# Patient Record
Sex: Male | Born: 1935 | Hispanic: No | Smoking: Former smoker
Health system: Southern US, Community
[De-identification: ages and names within clinical notes are randomized; demographics above are authoritative.]

## PROBLEM LIST (undated history)

## (undated) DIAGNOSIS — I10 Essential (primary) hypertension: Secondary | ICD-10-CM

## (undated) DIAGNOSIS — K5730 Diverticulosis of large intestine without perforation or abscess without bleeding: Secondary | ICD-10-CM

## (undated) DIAGNOSIS — E785 Hyperlipidemia, unspecified: Secondary | ICD-10-CM

## (undated) DIAGNOSIS — H409 Unspecified glaucoma: Secondary | ICD-10-CM

## (undated) DIAGNOSIS — Z7409 Other reduced mobility: Secondary | ICD-10-CM

## (undated) DIAGNOSIS — N400 Enlarged prostate without lower urinary tract symptoms: Secondary | ICD-10-CM

## (undated) DIAGNOSIS — H3530 Unspecified macular degeneration: Secondary | ICD-10-CM

## (undated) DIAGNOSIS — J449 Chronic obstructive pulmonary disease, unspecified: Secondary | ICD-10-CM

## (undated) DIAGNOSIS — I872 Venous insufficiency (chronic) (peripheral): Secondary | ICD-10-CM

## (undated) DIAGNOSIS — I4891 Unspecified atrial fibrillation: Secondary | ICD-10-CM

## (undated) DIAGNOSIS — H543 Unqualified visual loss, both eyes: Secondary | ICD-10-CM

## (undated) HISTORY — PX: HEMORRHOID SURGERY: SHX153

## (undated) HISTORY — DX: Unspecified atrial fibrillation: I48.91

## (undated) HISTORY — DX: Diverticulosis of large intestine without perforation or abscess without bleeding: K57.30

## (undated) HISTORY — DX: Chronic obstructive pulmonary disease, unspecified: J44.9

## (undated) HISTORY — DX: Other reduced mobility: Z74.09

## (undated) HISTORY — DX: Unspecified glaucoma: H40.9

## (undated) HISTORY — DX: Benign prostatic hyperplasia without lower urinary tract symptoms: N40.0

## (undated) HISTORY — DX: Unspecified macular degeneration: H35.30

## (undated) HISTORY — DX: Venous insufficiency (chronic) (peripheral): I87.2

## (undated) HISTORY — DX: Hyperlipidemia, unspecified: E78.5

## (undated) HISTORY — DX: Essential (primary) hypertension: I10

## (undated) HISTORY — DX: Unqualified visual loss, both eyes: H54.3

---

## 2010-03-20 HISTORY — PX: INGUINAL HERNIA REPAIR: SHX194

## 2013-12-08 ENCOUNTER — Encounter: Payer: Self-pay | Admitting: Family Medicine

## 2013-12-08 ENCOUNTER — Ambulatory Visit (INDEPENDENT_AMBULATORY_CARE_PROVIDER_SITE_OTHER): Payer: Medicare Other | Admitting: Family Medicine

## 2013-12-08 VITALS — BP 134/78 | HR 65 | Temp 98.8°F | Resp 18 | Ht 66.0 in | Wt 155.0 lb

## 2013-12-08 DIAGNOSIS — Z7409 Other reduced mobility: Secondary | ICD-10-CM

## 2013-12-08 DIAGNOSIS — I10 Essential (primary) hypertension: Secondary | ICD-10-CM

## 2013-12-08 DIAGNOSIS — H409 Unspecified glaucoma: Secondary | ICD-10-CM

## 2013-12-08 DIAGNOSIS — R69 Illness, unspecified: Secondary | ICD-10-CM

## 2013-12-08 DIAGNOSIS — E785 Hyperlipidemia, unspecified: Secondary | ICD-10-CM

## 2013-12-08 DIAGNOSIS — Z23 Encounter for immunization: Secondary | ICD-10-CM

## 2013-12-08 DIAGNOSIS — Z7189 Other specified counseling: Secondary | ICD-10-CM

## 2013-12-08 DIAGNOSIS — H3530 Unspecified macular degeneration: Secondary | ICD-10-CM

## 2013-12-08 DIAGNOSIS — Z7689 Persons encountering health services in other specified circumstances: Secondary | ICD-10-CM

## 2013-12-08 NOTE — Progress Notes (Signed)
Pre visit review using our clinic review tool, if applicable. No additional management support is needed unless otherwise documented below in the visit note. 

## 2013-12-08 NOTE — Progress Notes (Signed)
Office Note 12/09/2013  CC:  Chief Complaint  Patient presents with  . Establish Care    DR. Meixler from [place]   HPI:  Andrew Moses is a 80 y.o. White male who is here to establish care. Patient's most recent primary MD: Dr. Lonzo Candy in [place], [place]. Old records were not reviewed prior to or during today's visit.  Pneumonia vaccine 2014 per pt report. Reports chronic illnesses well controlled historically. Takes meds faithfully.  Poor eyesight limits him.  Macular deg--needs retinal specialist.   Also says he had recent blood tests that showed anemia, he took iron for a month or so, went back for another exam but was told retesting wasn't needed.  He states that other "routine"-type blood work has been done in the last few months as well.  He feels well, no acute complaints.   Past Medical History  Diagnosis Date  . HTN (hypertension)   . Hyperlipidemia   . BPH (benign prostatic hypertrophy)   . Glaucoma   . Diverticulosis of colon     w/ hx of "itis"  . Macular degeneration     L eye with minimal vision  . Impaired mobility   . Chronic venous insufficiency   . Impaired vision in both eyes     L>>R    Past Surgical History  Procedure Laterality Date  . Inguinal hernia repair  2012    bilat  . Hemorrhoid surgery      remote past    History reviewed. No pertinent family history.  History   Social History  . Marital Status: Widowed    Spouse Name: N/A    Number of Children: N/A  . Years of Education: N/A   Occupational History  . Not on file.   Social History Main Topics  . Smoking status: Former Games developer  . Smokeless tobacco: Never Used  . Alcohol Use: Yes     Comment: 4-5 oz daily  . Drug Use: No  . Sexual Activity: Not on file   Other Topics Concern  . Not on file   Social History Narrative   Widowed since 1998.   Retired Airline pilot.   Moved to [place] to be with in-laws and get closer supervision.   Remote tobacco hx, quit in his 40s.   Daily 4-5 oz of alcohol.   Exercises daily--stretches/light exercises in his home.   Enjoys DVDs and Music for relaxation.    Outpatient Encounter Prescriptions as of 12/08/2013  Medication Sig  . Aspirin (ECOTRIN PO) Take 160 mg by mouth.  Marland Kitchen atenolol (TENORMIN) 25 MG tablet Take 25 mg by mouth daily.  . bimatoprost (LUMIGAN) 0.03 % ophthalmic solution 1 drop at bedtime.  . cyanocobalamin 2000 MCG tablet Take 2,500 mcg by mouth daily.  Marland Kitchen losartan (COZAAR) 50 MG tablet Take 50 mg by mouth daily.  . Multiple Vitamin (MULTIVITAMIN) tablet Take 1 tablet by mouth daily.  . simvastatin (ZOCOR) 20 MG tablet Take 20 mg by mouth daily.  . tamsulosin (FLOMAX) 0.4 MG CAPS capsule Take 0.4 mg by mouth.   ROS Review of Systems  Constitutional: Negative for fever and fatigue.  HENT: Negative for congestion and sore throat.   Eyes: Negative for redness. Visual disturbance: no acute visual disturbance.  Respiratory: Negative for cough.   Cardiovascular: Positive for leg swelling (chronic bilateral LE venous insufficiency edema). Negative for chest pain.  Gastrointestinal: Negative for nausea and abdominal pain.  Genitourinary: Negative for dysuria.  Musculoskeletal: Negative for back pain and  joint swelling.  Skin: Negative for rash.  Neurological: Negative for weakness and headaches.  Hematological: Negative for adenopathy.  Psychiatric/Behavioral: Negative for confusion and dysphoric mood. The patient is not nervous/anxious.     PE; Blood pressure 134/78, pulse 65, temperature 98.8 F (37.1 C), temperature source Temporal, resp. rate 18, height  (1.676 m), weight 155 lb (70.308 kg), SpO2 99.00%. Repeat bp 130s/70s -manual cuff. Gen: Alert, well appearing.  Patient is oriented to person, place, time, and situation. AFFECT: pleasant, lucid thought and speech. ZOX:WRUE: no injection, icteris, swelling, or exudate.  EOMI, L pupil 4mm and nonreactive, R pupil 1-2 mm and nonreactive---this is  stable/chronic as per pt report today.  L RR dimmer than right. Mouth: lips without lesion/swelling.  Oral mucosa pink and moist. Oropharynx without erythema, exudate, or swelling.  Neck - No masses or thyromegaly or limitation in range of motion CV: RRR, 3/6 holosystolic murmur, S1 obscured, S2 distinct.  No diastolic murmur, no rub, no gallop.   Chest is clear, no wheezing or rales. Normal symmetric air entry throughout both lung fields. No chest wall deformities or tenderness. ABD: soft, NT/ND EXT: 2+ pitting edema in both LL's below the knees, with pigment changes c/w CV stasis  Pertinent labs:  None today  ASSESSMENT AND PLAN:   Encounter to establish care Reviewed history, current needs, will obtain old records.  Macular degeneration And glaucoma. Needs retinal specialist referral for ongoing care (he reports getting regularly scheduled eye injections in [place])--referred to Dr. [place] Royalty today.  HTN (hypertension), benign The current medical regimen is effective;  continue present plan and medications. Will review old records for most recent labs and determine if any are needed prior to our planned 4 mo f/u.  Hyperlipidemia Tolerating statin. Review old records for most recent lipid panel and hepatic panel.   Impaired mobility Generalized weakness and balance/instability impairment consistent with aging. Rolling walker rx'd today for help with balance and mobility.   Preventative health care: flu vaccine IM today.  An After Visit Summary was printed and given to the patient.  Return in about 4 months (around 04/09/2014) for routine chronic illness f/u.

## 2013-12-09 ENCOUNTER — Encounter: Payer: Self-pay | Admitting: Family Medicine

## 2013-12-09 DIAGNOSIS — E785 Hyperlipidemia, unspecified: Secondary | ICD-10-CM | POA: Insufficient documentation

## 2013-12-09 DIAGNOSIS — I10 Essential (primary) hypertension: Secondary | ICD-10-CM | POA: Insufficient documentation

## 2013-12-09 DIAGNOSIS — Z7409 Other reduced mobility: Secondary | ICD-10-CM | POA: Insufficient documentation

## 2013-12-09 DIAGNOSIS — H3530 Unspecified macular degeneration: Secondary | ICD-10-CM | POA: Insufficient documentation

## 2013-12-09 DIAGNOSIS — Z7689 Persons encountering health services in other specified circumstances: Secondary | ICD-10-CM | POA: Insufficient documentation

## 2013-12-09 NOTE — Assessment & Plan Note (Signed)
Generalized weakness and balance/instability impairment consistent with aging. Rolling walker rx'd today for help with balance and mobility.

## 2013-12-09 NOTE — Assessment & Plan Note (Signed)
Tolerating statin. Review old records for most recent lipid panel and hepatic panel.

## 2013-12-09 NOTE — Assessment & Plan Note (Signed)
And glaucoma. Needs retinal specialist referral for ongoing care (he reports getting regularly scheduled eye injections in [place])--referred to Dr. [place] Royalty today.

## 2013-12-09 NOTE — Assessment & Plan Note (Signed)
Reviewed history, current needs, will obtain old records.

## 2013-12-09 NOTE — Assessment & Plan Note (Signed)
The current medical regimen is effective;  continue present plan and medications. Will review old records for most recent labs and determine if any are needed prior to our planned 4 mo f/u.

## 2013-12-22 ENCOUNTER — Encounter (INDEPENDENT_AMBULATORY_CARE_PROVIDER_SITE_OTHER): Payer: Medicare Other | Admitting: Ophthalmology

## 2013-12-22 DIAGNOSIS — H34833 Tributary (branch) retinal vein occlusion, bilateral: Secondary | ICD-10-CM

## 2013-12-22 DIAGNOSIS — I10 Essential (primary) hypertension: Secondary | ICD-10-CM

## 2013-12-22 DIAGNOSIS — H3531 Nonexudative age-related macular degeneration: Secondary | ICD-10-CM

## 2013-12-22 DIAGNOSIS — H43813 Vitreous degeneration, bilateral: Secondary | ICD-10-CM

## 2013-12-22 DIAGNOSIS — H35033 Hypertensive retinopathy, bilateral: Secondary | ICD-10-CM

## 2013-12-24 ENCOUNTER — Encounter: Payer: Self-pay | Admitting: Podiatry

## 2013-12-24 ENCOUNTER — Ambulatory Visit (INDEPENDENT_AMBULATORY_CARE_PROVIDER_SITE_OTHER): Payer: Medicare Other | Admitting: Podiatry

## 2013-12-24 VITALS — BP 124/60 | HR 60 | Resp 12

## 2013-12-24 DIAGNOSIS — M79676 Pain in unspecified toe(s): Secondary | ICD-10-CM

## 2013-12-24 DIAGNOSIS — B351 Tinea unguium: Secondary | ICD-10-CM

## 2013-12-24 DIAGNOSIS — L84 Corns and callosities: Secondary | ICD-10-CM

## 2013-12-24 NOTE — Progress Notes (Signed)
   Subjective:    Patient ID: Andrew Moses, male    DOB: [birth year], 80 y.o.   MRN: [id number]  HPI N-SORE, THICK SKIN L-B/L FOOT D-4 YEARS O-SLOWLY C-WORSE A-PRESSURE T-NONE  He is also complaining of painful toenails and walking wearing shoes and request debridement He describes podiatric care including debridement is toenails and keratoses   Review of Systems  Musculoskeletal: Positive for gait problem.  All other systems reviewed and are negative.      Objective:   Physical Exam  Orientated x3 white male  Vascular: DP and PT pulses 2/4 bilaterally Pitting edema dorsal ankles bilaterally  Neurological: Sensation to 10 g monofilament wire intact 2/5 right and 3/5 left Vibratory sensation nonreactive bilaterally Ankle reflexes reactive bilaterally  Dermatological: Manson PasseyBrown hyperpigmented skin lower legs and ankles bilaterally  The toenails are elongated, brittle, hypertrophic, discolored 6-10 Plantar keratoses first MPJ bilaterally Keratoses distal third toes bilaterally  Musculoskeletal: Medium March contour bilaterally No deformities noted bilaterally There is no restriction ankle, subtalar, midtarsal joints bilaterally       Assessment & Plan:   Assessment: Peripheral neuropathy bilaterally Symptomatic onychomycoses 6-10 Keratoses x4  Plan: Nails x10 and keratoses x4 debrided without a bleeding  Reappoint at three-month intervals for skin a nail debridement

## 2013-12-30 ENCOUNTER — Other Ambulatory Visit: Payer: Self-pay | Admitting: Family Medicine

## 2013-12-30 MED ORDER — ATENOLOL 25 MG PO TABS: 25.0000 mg | ORAL_TABLET | Freq: Every day | ORAL | Status: DC

## 2014-01-05 ENCOUNTER — Encounter (INDEPENDENT_AMBULATORY_CARE_PROVIDER_SITE_OTHER): Payer: Medicare Other | Admitting: Ophthalmology

## 2014-01-06 ENCOUNTER — Ambulatory Visit (HOSPITAL_BASED_OUTPATIENT_CLINIC_OR_DEPARTMENT_OTHER)
Admission: RE | Admit: 2014-01-06 | Discharge: 2014-01-06 | Disposition: A | Discharge status: Home / Self Care | Payer: Medicare Other | Source: Ambulatory Visit | Attending: Family Medicine | Admitting: Family Medicine

## 2014-01-06 ENCOUNTER — Encounter: Payer: Self-pay | Admitting: Family Medicine

## 2014-01-06 ENCOUNTER — Ambulatory Visit (INDEPENDENT_AMBULATORY_CARE_PROVIDER_SITE_OTHER): Payer: Medicare Other | Admitting: Family Medicine

## 2014-01-06 VITALS — BP 119/77 | HR 105 | Temp 99.6°F | Resp 22 | Ht 66.0 in | Wt 156.0 lb

## 2014-01-06 DIAGNOSIS — I499 Cardiac arrhythmia, unspecified: Secondary | ICD-10-CM | POA: Insufficient documentation

## 2014-01-06 DIAGNOSIS — M11231 Other chondrocalcinosis, right wrist: Secondary | ICD-10-CM | POA: Insufficient documentation

## 2014-01-06 DIAGNOSIS — M19041 Primary osteoarthritis, right hand: Secondary | ICD-10-CM | POA: Insufficient documentation

## 2014-01-06 DIAGNOSIS — I4891 Unspecified atrial fibrillation: Secondary | ICD-10-CM

## 2014-01-06 DIAGNOSIS — S66911A Strain of unspecified muscle, fascia and tendon at wrist and hand level, right hand, initial encounter: Secondary | ICD-10-CM

## 2014-01-06 DIAGNOSIS — M25531 Pain in right wrist: Secondary | ICD-10-CM | POA: Diagnosis present

## 2014-01-06 DIAGNOSIS — M85841 Other specified disorders of bone density and structure, right hand: Secondary | ICD-10-CM | POA: Diagnosis not present

## 2014-01-06 HISTORY — DX: Unspecified atrial fibrillation: I48.91

## 2014-01-06 MED ORDER — TAMSULOSIN HCL 0.4 MG PO CAPS: 0.4000 mg | ORAL_CAPSULE | Freq: Every day | ORAL | Status: DC

## 2014-01-06 NOTE — Progress Notes (Signed)
OFFICE VISIT  01/06/2014   CC:  Chief Complaint  Patient presents with  . Wrist Pain    Right sided    HPI:    Patient is a 80 y.o. Caucasian male who presents for right wrist pain. Hx of recurrent right wrist pain for years, and in the last few days it is hurting significantly worse. Increased pain noted after straining wrist during a slip from his bed.  No hx of wrist fracture or other bone fracture.  The wrist hurts ONLY if he touches the volar surface or tries to use the wrist.  No OTc meds tried lately.   He denies feeling SOB, no recent cough, no fevers.  Past Medical History  Diagnosis Date  . HTN (hypertension)   . Hyperlipidemia   . BPH (benign prostatic hypertrophy)   . Glaucoma   . Diverticulosis of colon     w/ hx of "itis"  . Macular degeneration     L eye with minimal vision  . Impaired mobility   . Chronic venous insufficiency   . Impaired vision in both eyes     L>>R    Past Surgical History  Procedure Laterality Date  . Inguinal hernia repair  2012    bilat  . Hemorrhoid surgery      remote past    Outpatient Prescriptions Prior to Visit  Medication Sig Dispense Refill  . Aspirin (ECOTRIN PO) Take 160 mg by mouth.      Marland Kitchen. atenolol (TENORMIN) 25 MG tablet Take 1 tablet (25 mg total) by mouth daily.  30 tablet  3  . bimatoprost (LUMIGAN) 0.03 % ophthalmic solution 1 drop at bedtime.      . cyanocobalamin 2000 MCG tablet Take 2,500 mcg by mouth daily.      Marland Kitchen. losartan (COZAAR) 50 MG tablet Take 50 mg by mouth daily.      . Multiple Vitamin (MULTIVITAMIN) tablet Take 1 tablet by mouth daily.      . simvastatin (ZOCOR) 20 MG tablet Take 20 mg by mouth daily.      . tamsulosin (FLOMAX) 0.4 MG CAPS capsule Take 0.4 mg by mouth.       No facility-administered medications prior to visit.    No Known Allergies  ROS As per HPI  PE: Blood pressure 119/77, pulse 105, temperature 99.6 F (37.6 C), temperature source Temporal, resp. rate 22, height 5\' 6"   (1.676 m), weight 156 lb (70.761 kg), SpO2 95.00%. Gen: Alert, well appearing.  Patient is oriented to person, place, time, and situation. L pupil 2-3 mm and nonreactive, R pupil pinpoint and nonreactive. EOMI.  No facial or tongue asymmetry.   CV: Irregular rhythm, tachy to 120 or so, 1-2 systolic murmur at apex, without rub or gallop. Chest is clear, no wheezing or rales. Normal symmetric air entry throughout both lung fields. No chest wall deformities or tenderness. Right wrist with limited ROM due to pain/stiffness, esp in flexion.  No swelling or erythema.  Mild focal TTP over  Wrist flexor tendons where they cross the wrist into the palm.  Tinel's neg.  He is unable to do phalen's testing due to stiffness of both wrists. Finger color and strength normal bilat.    LABS:  None today  IMPRESSION AND PLAN:  1) Acute-on-chronic right wrist pain, likely strain assoc with recent "slip" out of bed. This was an "innocent" slip out of bed, per his report---no presyncope or syncope and he says he has not been feeling ill  lately in any way.  Fitted pt with right wrist brace and ordered wrist x-ray today.  2) Irregular rhythm, tachycardia (mild): suspect a fib/flutter with RVR. I tried to engage pt in conversation about this today, particularly making sure he was aware of increased risk of stroke with this type of arrhythmia.  He quickly and quite pointedly stated that he knew he had an arrhythmia already and that he wanted no procedures or blood thinners.  He says he does not feel palpitations, CP, SOB, or dizziness.  Therefore, I agreed to do no EKG and no labs today and will not push him to take coumadin or DOAC.  No changes in meds today.   He reiterated that [place] new [place] had [place] condition as well and pt says the approach they agreed upon was NO WORKUP or anticoagulation (other than his 162mg  ASA qd).  An After Visit Summary was printed and given to the patient.  Spent 25 min with pt  today, with >50% of this time spent in counseling and care coordination regarding the above problems.  FOLLOW UP: Return if symptoms worsen or fail to improve.

## 2014-01-06 NOTE — Progress Notes (Signed)
Pre visit review using our clinic review tool, if applicable. No additional management support is needed unless otherwise documented below in the visit note. 

## 2014-01-26 ENCOUNTER — Encounter (INDEPENDENT_AMBULATORY_CARE_PROVIDER_SITE_OTHER): Payer: Medicare Other | Admitting: Ophthalmology

## 2014-01-26 DIAGNOSIS — H35033 Hypertensive retinopathy, bilateral: Secondary | ICD-10-CM

## 2014-01-26 DIAGNOSIS — I10 Essential (primary) hypertension: Secondary | ICD-10-CM

## 2014-01-26 DIAGNOSIS — H43813 Vitreous degeneration, bilateral: Secondary | ICD-10-CM

## 2014-01-26 DIAGNOSIS — H34833 Tributary (branch) retinal vein occlusion, bilateral: Secondary | ICD-10-CM

## 2014-02-19 ENCOUNTER — Encounter: Payer: Self-pay | Admitting: Podiatry

## 2014-02-19 ENCOUNTER — Ambulatory Visit (INDEPENDENT_AMBULATORY_CARE_PROVIDER_SITE_OTHER): Payer: Medicare Other | Admitting: Podiatry

## 2014-02-19 DIAGNOSIS — R609 Edema, unspecified: Secondary | ICD-10-CM

## 2014-02-19 DIAGNOSIS — I83211 Varicose veins of right lower extremity with both ulcer of thigh and inflammation: Secondary | ICD-10-CM

## 2014-02-19 DIAGNOSIS — I83212 Varicose veins of right lower extremity with both ulcer of calf and inflammation: Secondary | ICD-10-CM | POA: Diagnosis not present

## 2014-02-19 DIAGNOSIS — I83219 Varicose veins of right lower extremity with both ulcer of unspecified site and inflammation: Secondary | ICD-10-CM | POA: Diagnosis not present

## 2014-02-19 DIAGNOSIS — I83213 Varicose veins of right lower extremity with both ulcer of ankle and inflammation: Secondary | ICD-10-CM | POA: Diagnosis not present

## 2014-02-19 DIAGNOSIS — I83214 Varicose veins of right lower extremity with both ulcer of heel and midfoot and inflammation: Secondary | ICD-10-CM

## 2014-02-19 DIAGNOSIS — I83218 Varicose veins of right lower extremity with both ulcer of other part of lower extremity and inflammation: Secondary | ICD-10-CM

## 2014-02-19 DIAGNOSIS — I83215 Varicose veins of right lower extremity with both ulcer other part of foot and inflammation: Secondary | ICD-10-CM

## 2014-02-19 NOTE — Progress Notes (Signed)
Subjective:     Patient ID: Andrew Moses, male   DOB: [birth year], 80 y.o.   MRN: [id number]  HPI patient states with caregiver that his right foot in the ankle has been weeping with some clear drainage and that he has had some swelling in his foot and ankle. States it's been present for a few days   Review of Systems     Objective:   Physical Exam Neurovascular status unchanged from previous visits with edema in the right foot and ankle right that is related to his venous system and he does have on the medial side some very slight breakdown with weepiness but no active ulceration    Assessment:     Edema of the right ankle secondary to venous disease with very beginnings of ulceration medial side    Plan:     Educated patient and caregiver and today applied Unna boot and Ace wrap surgical shoe and instructed if any changes in toe color were to recur or this were to give him any discomfort to take it off immediately if not leave on for 3 days

## 2014-02-23 ENCOUNTER — Ambulatory Visit (INDEPENDENT_AMBULATORY_CARE_PROVIDER_SITE_OTHER): Payer: Medicare Other | Admitting: Podiatry

## 2014-02-23 ENCOUNTER — Encounter: Payer: Self-pay | Admitting: Podiatry

## 2014-02-23 VITALS — BP 149/84 | HR 88 | Resp 16

## 2014-02-23 DIAGNOSIS — I83215 Varicose veins of right lower extremity with both ulcer other part of foot and inflammation: Secondary | ICD-10-CM

## 2014-02-23 DIAGNOSIS — I83212 Varicose veins of right lower extremity with both ulcer of calf and inflammation: Secondary | ICD-10-CM

## 2014-02-23 DIAGNOSIS — I83218 Varicose veins of right lower extremity with both ulcer of other part of lower extremity and inflammation: Secondary | ICD-10-CM

## 2014-02-23 DIAGNOSIS — I83214 Varicose veins of right lower extremity with both ulcer of heel and midfoot and inflammation: Secondary | ICD-10-CM

## 2014-02-23 DIAGNOSIS — I83219 Varicose veins of right lower extremity with both ulcer of unspecified site and inflammation: Secondary | ICD-10-CM

## 2014-02-23 DIAGNOSIS — I83213 Varicose veins of right lower extremity with both ulcer of ankle and inflammation: Secondary | ICD-10-CM

## 2014-02-23 DIAGNOSIS — I83211 Varicose veins of right lower extremity with both ulcer of thigh and inflammation: Secondary | ICD-10-CM

## 2014-02-23 NOTE — Patient Instructions (Signed)
Keep right leg at the level legs when sitting Removal of boot on right leg the evening before next visit or the morning of the next visit and shower at that time Wear surgical shoe on right foot to accommodate the [employer] LockerUnna boot

## 2014-02-23 NOTE — Progress Notes (Signed)
Patient ID: Andrew Moses, male   DOB: 1914/07/10, 80 y.o.   MRN: [id number]  Subjective: This patient presents with his daughter-in-law to evaluate skin lesions on right lower ankle which were treated on 02/19/2014 with a unna boot. Patient wore the [employer]ockerUnna boot for several days and remove the boot  Objective: 80 year old white male appears orientated 3 transfers from wheelchair to treatment chair  Palpable and symmetrical pedal pulses bilaterally Pitting edema right and left lower legs with hyperpigmentation noted Superficial ulcerations distal medial right ankle 8 mm with granular base and more proximal lesions 5 mm. Low-grade erythema surrounds both sites with serous drainage noted.  Assessment:  Pitting edema bilaterally Venous stasis ulcerations 2 , right ankle  Plan: Reapply little boat and Coflex bandage to the right lower leg Advised to leave bandages on until the night before morning of next visit in 7 days

## 2014-03-02 ENCOUNTER — Ambulatory Visit (INDEPENDENT_AMBULATORY_CARE_PROVIDER_SITE_OTHER): Payer: Medicare Other | Admitting: Podiatry

## 2014-03-02 ENCOUNTER — Encounter: Payer: Self-pay | Admitting: Podiatry

## 2014-03-02 VITALS — BP 158/84 | HR 99 | Temp 97.9°F | Resp 17

## 2014-03-02 DIAGNOSIS — I83219 Varicose veins of right lower extremity with both ulcer of unspecified site and inflammation: Secondary | ICD-10-CM

## 2014-03-02 DIAGNOSIS — R600 Localized edema: Secondary | ICD-10-CM

## 2014-03-02 DIAGNOSIS — I83214 Varicose veins of right lower extremity with both ulcer of heel and midfoot and inflammation: Secondary | ICD-10-CM

## 2014-03-02 DIAGNOSIS — I83213 Varicose veins of right lower extremity with both ulcer of ankle and inflammation: Secondary | ICD-10-CM

## 2014-03-02 DIAGNOSIS — I83211 Varicose veins of right lower extremity with both ulcer of thigh and inflammation: Secondary | ICD-10-CM

## 2014-03-02 DIAGNOSIS — I83215 Varicose veins of right lower extremity with both ulcer other part of foot and inflammation: Secondary | ICD-10-CM

## 2014-03-02 DIAGNOSIS — I83218 Varicose veins of right lower extremity with both ulcer of other part of lower extremity and inflammation: Secondary | ICD-10-CM

## 2014-03-02 DIAGNOSIS — I83212 Varicose veins of right lower extremity with both ulcer of calf and inflammation: Secondary | ICD-10-CM

## 2014-03-02 NOTE — Patient Instructions (Signed)
Remove boot night before next visit

## 2014-03-03 NOTE — Progress Notes (Signed)
Patient ID: Andrew Moses, male   DOB: 1914-06-05, 80 y.o.   MRN: [id number]  Subjective: Patient presents with daughter in law to evaluate ongoing venous ulcers on the right lower extremity.Andrew Moses: Unna boots 2 applied on 02/19/2014 and 02/23/2014.  Objective: Patient appears very lethargic, however, he does respond to questioning  Medial right ankle The proximal medial right ankle ulcer is superficial measuring 3 x 5 mm The distal medial ulcer is closed  Assessment: Reducing ulceration size right ankle Closure of the distal medial right varicose ulcer  Plan: Apply Unna boot #32 right lower extremity Remove the night before next visit in 7 days  Advised patient's daughter not to have medical evaluation as soon as possible for Andrew Moses  Reappoint 7 days

## 2014-03-04 ENCOUNTER — Ambulatory Visit (INDEPENDENT_AMBULATORY_CARE_PROVIDER_SITE_OTHER): Payer: Medicare Other | Admitting: Family Medicine

## 2014-03-04 ENCOUNTER — Encounter: Payer: Self-pay | Admitting: Family Medicine

## 2014-03-04 ENCOUNTER — Ambulatory Visit (HOSPITAL_BASED_OUTPATIENT_CLINIC_OR_DEPARTMENT_OTHER)
Admission: RE | Admit: 2014-03-04 | Discharge: 2014-03-04 | Disposition: A | Discharge status: Home / Self Care | Payer: Medicare Other | Source: Ambulatory Visit | Attending: Family Medicine | Admitting: Family Medicine

## 2014-03-04 VITALS — BP 145/80 | HR 106 | Temp 98.0°F | Resp 24

## 2014-03-04 DIAGNOSIS — R7989 Other specified abnormal findings of blood chemistry: Secondary | ICD-10-CM

## 2014-03-04 DIAGNOSIS — I482 Chronic atrial fibrillation, unspecified: Secondary | ICD-10-CM

## 2014-03-04 DIAGNOSIS — I700 Atherosclerosis of aorta: Secondary | ICD-10-CM | POA: Diagnosis not present

## 2014-03-04 DIAGNOSIS — R918 Other nonspecific abnormal finding of lung field: Secondary | ICD-10-CM | POA: Diagnosis not present

## 2014-03-04 DIAGNOSIS — R0602 Shortness of breath: Secondary | ICD-10-CM | POA: Insufficient documentation

## 2014-03-04 DIAGNOSIS — J90 Pleural effusion, not elsewhere classified: Secondary | ICD-10-CM | POA: Insufficient documentation

## 2014-03-04 DIAGNOSIS — R946 Abnormal results of thyroid function studies: Secondary | ICD-10-CM

## 2014-03-04 LAB — CBC WITH DIFFERENTIAL/PLATELET
Basophils Absolute: 0.1 10*3/uL (ref 0.0–0.1)
Basophils Relative: 0.6 % (ref 0.0–3.0)
EOS PCT: 1.6 % (ref 0.0–5.0)
Eosinophils Absolute: 0.1 10*3/uL (ref 0.0–0.7)
HEMATOCRIT: 38.4 % — AB (ref 39.0–52.0)
Hemoglobin: 12.3 g/dL — ABNORMAL LOW (ref 13.0–17.0)
LYMPHS ABS: 1.2 10*3/uL (ref 0.7–4.0)
Lymphocytes Relative: 14.4 % (ref 12.0–46.0)
MCHC: 32.1 g/dL (ref 30.0–36.0)
MCV: 92.4 fl (ref 78.0–100.0)
Monocytes Absolute: 0.8 10*3/uL (ref 0.1–1.0)
Monocytes Relative: 9.1 % (ref 3.0–12.0)
Neutro Abs: 6.1 10*3/uL (ref 1.4–7.7)
Neutrophils Relative %: 74.3 % (ref 43.0–77.0)
Platelets: 242 10*3/uL (ref 150.0–400.0)
RBC: 4.16 Mil/uL — ABNORMAL LOW (ref 4.22–5.81)
RDW: 13.9 % (ref 11.5–15.5)
WBC: 8.2 10*3/uL (ref 4.0–10.5)

## 2014-03-04 LAB — COMPREHENSIVE METABOLIC PANEL
ALK PHOS: 71 U/L (ref 39–117)
ALT: 28 U/L (ref 0–53)
AST: 34 U/L (ref 0–37)
Albumin: 3.4 g/dL — ABNORMAL LOW (ref 3.5–5.2)
BUN: 23 mg/dL (ref 6–23)
CO2: 27 mEq/L (ref 19–32)
Calcium: 8.8 mg/dL (ref 8.4–10.5)
Chloride: 103 mEq/L (ref 96–112)
Creatinine, Ser: 0.8 mg/dL (ref 0.4–1.5)
GFR: 97.74 mL/min (ref >60.00)
GLUCOSE: 119 mg/dL — AB (ref 70–99)
Potassium: 4.2 mEq/L (ref 3.5–5.1)
Sodium: 138 mEq/L (ref 135–145)
TOTAL PROTEIN: 5.3 g/dL — AB (ref 6.0–8.3)
Total Bilirubin: 0.5 mg/dL (ref 0.2–1.2)

## 2014-03-04 LAB — BRAIN NATRIURETIC PEPTIDE: PRO B NATRI PEPTIDE: 498 pg/mL — AB (ref 0.0–100.0)

## 2014-03-04 LAB — TSH: TSH: 5.31 u[IU]/mL — AB (ref 0.35–4.50)

## 2014-03-04 MED ORDER — AZITHROMYCIN 250 MG PO TABS: ORAL_TABLET | ORAL | Status: DC

## 2014-03-04 NOTE — Progress Notes (Signed)
OFFICE NOTE  03/04/2014  CC:  Chief Complaint  Patient presents with  . Follow-up   HPI: Patient is a 80 y.o. Caucasian male who is here for wheezing.  Insidious onset, worsening slowly over the last 3 wks.  Daughter in law with him today says he has been resistant to coming in to see me over the last few weeks.  He feels SOB but has no cough and is not coughing up phlegm but says his chest doesn't feel right. Denies CP.  Increased LE swelling lately, requires an UNA boot on right LL lately (podiatrist-Dr. Leeanne Deeduchman).  No nausea, no palpitations or heart skipping/racing.   Pertinent PMH:  Past medical, surgical, social, and family history reviewed and no changes are noted since last office visit.  MEDS:  Outpatient Prescriptions Prior to Visit  Medication Sig Dispense Refill  . Aspirin (ECOTRIN PO) Take 160 mg by mouth.    Marland Kitchen. atenolol (TENORMIN) 25 MG tablet Take 1 tablet (25 mg total) by mouth daily. 30 tablet 3  . bimatoprost (LUMIGAN) 0.03 % ophthalmic solution 1 drop at bedtime.    . cyanocobalamin 2000 MCG tablet Take 2,500 mcg by mouth daily.    Marland Kitchen. losartan (COZAAR) 50 MG tablet Take 50 mg by mouth daily.    . Multiple Vitamin (MULTIVITAMIN) tablet Take 1 tablet by mouth daily.    . simvastatin (ZOCOR) 20 MG tablet Take 20 mg by mouth daily.    . tamsulosin (FLOMAX) 0.4 MG CAPS capsule Take 1 capsule (0.4 mg total) by mouth daily. 90 capsule 1   No facility-administered medications prior to visit.    PE: Blood pressure 145/80, pulse 106, temperature 98 F (36.7 C), temperature source Temporal, resp. rate 24, SpO2 96 %. Gen: Alert, sitting in wheelchair breathing heavily.  Patient is oriented to person, place, time, and situation.  He speaks lucidly. ZOX:WRUEENT:Eyes: no injection, icteris, swelling, or exudate.  EOMI Mouth: lips without lesion/swelling.  Oral mucosa pink and moist. Oropharynx without erythema, exudate, or swelling.  CV: Irreg irreg, tachy, no murmur LUNGS: clear  but mildly diminished aeration on insp and exp, with prolonged exp phase. Mildly labored resps. ABD: soft, ND/NT. EXT: 3+ pitting edema in both LLs.  UNA boot on R LL.  12 lead EKG today: a-fib, rate 98, poor R wave progression, frequent ectopic vent beat. No prior EKG for comparison.  IMPRESSION AND PLAN:  1) SOB w/out pain or cough.  With hx of a fib I suspect he may be having periods of RVR and inadequate LV output leading to CHF.   Will check CBC, CMET, TSH, BNP and CXR today. If signif pulm edema on CXR then I told pt he will need to go to hosp to get this treated. If mild pulm vasc congest or no edema/some finding suggesting different path process then will treat approp as outpatient.    An After Visit Summary was printed and given to the patient.  FOLLOW UP: 2 d in office.  May need [facility] nursing consult.

## 2014-03-04 NOTE — Addendum Note (Signed)
Addended by: Eulah PontALBRIGHT, LISA M on: 03/04/2014 01:53 PM   Modules accepted: Orders

## 2014-03-04 NOTE — Progress Notes (Signed)
Pre visit review using our clinic review tool, if applicable. No additional management support is needed unless otherwise documented below in the visit note. 

## 2014-03-05 ENCOUNTER — Encounter (INDEPENDENT_AMBULATORY_CARE_PROVIDER_SITE_OTHER): Payer: Medicare Other | Admitting: Ophthalmology

## 2014-03-05 ENCOUNTER — Other Ambulatory Visit (INDEPENDENT_AMBULATORY_CARE_PROVIDER_SITE_OTHER): Payer: Medicare Other

## 2014-03-05 ENCOUNTER — Other Ambulatory Visit: Payer: Self-pay | Admitting: Family Medicine

## 2014-03-05 DIAGNOSIS — H43813 Vitreous degeneration, bilateral: Secondary | ICD-10-CM

## 2014-03-05 DIAGNOSIS — H35033 Hypertensive retinopathy, bilateral: Secondary | ICD-10-CM

## 2014-03-05 DIAGNOSIS — I10 Essential (primary) hypertension: Secondary | ICD-10-CM

## 2014-03-05 DIAGNOSIS — R946 Abnormal results of thyroid function studies: Secondary | ICD-10-CM

## 2014-03-05 DIAGNOSIS — H34832 Tributary (branch) retinal vein occlusion, left eye: Secondary | ICD-10-CM

## 2014-03-05 DIAGNOSIS — H34811 Central retinal vein occlusion, right eye: Secondary | ICD-10-CM

## 2014-03-05 LAB — T4, FREE: Free T4: 0.85 ng/dL (ref 0.60–1.60)

## 2014-03-05 MED ORDER — FUROSEMIDE 40 MG PO TABS: 40.0000 mg | ORAL_TABLET | Freq: Every day | ORAL | Status: DC

## 2014-03-05 NOTE — Addendum Note (Signed)
Addended by: Cydney OkAUGUSTIN, Siya Flurry N on: 03/05/2014 08:37 AM   Modules accepted: Orders

## 2014-03-06 ENCOUNTER — Encounter: Payer: Self-pay | Admitting: Family Medicine

## 2014-03-06 ENCOUNTER — Ambulatory Visit (INDEPENDENT_AMBULATORY_CARE_PROVIDER_SITE_OTHER): Payer: Medicare Other | Admitting: Family Medicine

## 2014-03-06 VITALS — BP 123/74 | HR 100 | Temp 97.5°F | Resp 18 | Wt 168.0 lb

## 2014-03-06 DIAGNOSIS — R0600 Dyspnea, unspecified: Secondary | ICD-10-CM

## 2014-03-06 DIAGNOSIS — I482 Chronic atrial fibrillation, unspecified: Secondary | ICD-10-CM

## 2014-03-06 DIAGNOSIS — R0603 Acute respiratory distress: Secondary | ICD-10-CM

## 2014-03-06 DIAGNOSIS — J810 Acute pulmonary edema: Secondary | ICD-10-CM

## 2014-03-06 DIAGNOSIS — J189 Pneumonia, unspecified organism: Secondary | ICD-10-CM

## 2014-03-06 LAB — T3: T3, Total: 88.3 ng/dL (ref 80.0–204.0)

## 2014-03-06 LAB — BASIC METABOLIC PANEL
BUN: 29 mg/dL — ABNORMAL HIGH (ref 6–23)
CHLORIDE: 100 meq/L (ref 96–112)
CO2: 28 meq/L (ref 19–32)
CREATININE: 0.79 mg/dL (ref 0.50–1.35)
Calcium: 9 mg/dL (ref 8.4–10.5)
Glucose, Bld: 68 mg/dL — ABNORMAL LOW (ref 70–99)
Potassium: 4.1 mEq/L (ref 3.5–5.3)
Sodium: 140 mEq/L (ref 135–145)

## 2014-03-06 MED ORDER — FUROSEMIDE 40 MG PO TABS: ORAL_TABLET | ORAL | Status: DC

## 2014-03-06 NOTE — Progress Notes (Signed)
OFFICE NOTE  03/06/2014  CC:  Chief Complaint  Patient presents with  . Follow-up   HPI: Patient is a 80 y.o. Caucasian male who is here for 2 day f/u mild resp distress from a mixture of pneumonia and pulm edema from mildly uncontrolled a fib with RVR.  I started her on azithromycin and lasix (he has taken 40 bid x 1d, then 40mg  x 1 dose today) as outpt.  No fever.  Still with some rattly cough.  Denies feeling SOB anymore.  Urinating a lot.  He has received bad news from eye MD that his macular deg is progressing.  Pertinent PMH:  Past medical, surgical, social, and family history reviewed and no changes are noted since last office visit.  MEDS:  Outpatient Prescriptions Prior to Visit  Medication Sig Dispense Refill  . Aspirin (ECOTRIN PO) Take 160 mg by mouth.    Marland Kitchen. atenolol (TENORMIN) 25 MG tablet Take 1 tablet (25 mg total) by mouth daily. 30 tablet 3  . azithromycin (ZITHROMAX) 250 MG tablet 2 tabs QD, then 1 tab QD x 4 days. 6 tablet 0  . bimatoprost (LUMIGAN) 0.03 % ophthalmic solution 1 drop at bedtime.    . cyanocobalamin 2000 MCG tablet Take 2,500 mcg by mouth daily.    . furosemide (LASIX) 40 MG tablet Take 1 tablet (40 mg total) by mouth daily. 60 tablet 0  . losartan (COZAAR) 50 MG tablet Take 50 mg by mouth daily.    . Multiple Vitamin (MULTIVITAMIN) tablet Take 1 tablet by mouth daily.    . simvastatin (ZOCOR) 20 MG tablet Take 20 mg by mouth daily.    . tamsulosin (FLOMAX) 0.4 MG CAPS capsule Take 1 capsule (0.4 mg total) by mouth daily. 90 capsule 1   No facility-administered medications prior to visit.    PE: Blood pressure 123/74, pulse 100, temperature 97.5 F (36.4 C), temperature source Oral, resp. rate 18, weight 168 lb (76.204 kg), SpO2 93 %. Gen: Alert, well appearing.  Patient is oriented to person, place, time, and situation. Speaking in complete sentences.  Lips pink. Oropharynx pink/moist. CV: Irreg Irreg, 2/6 syst murmur heard best at apex, rate  about 90. LUNGS: CTA bilat, nonlabored. LLE: 3+ pitting edema at mid tibial level, no weeping. RLE with una boot wrapping in place.  LAB: None today RECENT:   Chemistry      Component Value Date/Time   NA 138 03/04/2014 1211   K 4.2 03/04/2014 1211   CL 103 03/04/2014 1211   CO2 27 03/04/2014 1211   BUN 23 03/04/2014 1211   CREATININE 0.8 03/04/2014 1211      Component Value Date/Time   CALCIUM 8.8 03/04/2014 1211   ALKPHOS 71 03/04/2014 1211   AST 34 03/04/2014 1211   ALT 28 03/04/2014 1211   BILITOT 0.5 03/04/2014 1211     Lab Results  Component Value Date   WBC 8.2 03/04/2014   HGB 12.3* 03/04/2014   HCT 38.4* 03/04/2014   MCV 92.4 03/04/2014   PLT 242.0 03/04/2014      IMPRESSION AND PLAN:  1) Recent pulm illness: mix of pneumonia and pulm edema. He is significantly improved.  Finish z-pack. Decrease lasix to 1/2 of 40mg  lasix qd. We had a thorough discussion about low sodium diet today and I gave him a low sodium diet handout. BMET today. I feel like his vent response to his a-fib has naturally calmed down as we've decreased his volume status some. He has f/u  visit with Dr. Leeanne Deeduchman to get LE swelling/Una boot checked in January. Signs/symptoms to call or return for were reviewed and pt expressed understanding.  An After Visit Summary was printed and given to the patient.  FOLLOW UP: 1 mo, earlier if needed. We may touch on the topic of HH nursing assistance at next f/u.

## 2014-03-06 NOTE — Addendum Note (Signed)
Addended by: Cydney OkAUGUSTIN, Eaton Folmar N on: 03/06/2014 03:27 PM   Modules accepted: Orders

## 2014-03-06 NOTE — Progress Notes (Signed)
Pre visit review using our clinic review tool, if applicable. No additional management support is needed unless otherwise documented below in the visit note. 

## 2014-03-09 ENCOUNTER — Encounter: Payer: Self-pay | Admitting: Podiatry

## 2014-03-09 ENCOUNTER — Ambulatory Visit (INDEPENDENT_AMBULATORY_CARE_PROVIDER_SITE_OTHER): Payer: Medicare Other | Admitting: Podiatry

## 2014-03-09 VITALS — BP 141/72 | HR 77 | Temp 96.9°F | Resp 13

## 2014-03-09 DIAGNOSIS — M79676 Pain in unspecified toe(s): Secondary | ICD-10-CM

## 2014-03-09 DIAGNOSIS — B351 Tinea unguium: Secondary | ICD-10-CM

## 2014-03-09 NOTE — Progress Notes (Signed)
Patient ID: Andrew Moses, male   DOB: 1914/06/11, 80 y.o.   MRN: [id number]  Subjective: This patient presents for follow-up care for a venous ulceration right lower leg after application of 3 Unna Boots.. Is also complaining of uncomfortable toenails and requests debridement.  Objective: Patient transfers from wheelchair to treatment chair with family friend present Pitting edema noted bilaterally Ulcerations medial right lower leg and ankle 2 are closed The toenails are elongated, brittle, hypertrophic and tender to palpation 6-10 Keratoses third toes bilaterally and plantar first MPJ bilaterally  Assessment: Healed venous ulcerations 2 right lower extremity Symptomatic onychomycoses 6-10  Plan: Nails 10 and keratoses are debrided without a bleeding  Patient is discharged from treatment of venous ulceration  Reappoint at three-month intervals for toenail debridement

## 2014-03-16 ENCOUNTER — Other Ambulatory Visit (INDEPENDENT_AMBULATORY_CARE_PROVIDER_SITE_OTHER): Payer: Medicare Other

## 2014-03-16 DIAGNOSIS — I4891 Unspecified atrial fibrillation: Secondary | ICD-10-CM

## 2014-03-16 DIAGNOSIS — R609 Edema, unspecified: Secondary | ICD-10-CM

## 2014-03-16 LAB — BASIC METABOLIC PANEL
BUN: 18 mg/dL (ref 6–23)
CALCIUM: 9.1 mg/dL (ref 8.4–10.5)
CO2: 30 mEq/L (ref 19–32)
Chloride: 100 mEq/L (ref 96–112)
Creatinine, Ser: 1 mg/dL (ref 0.4–1.5)
GFR: 76 mL/min (ref >60.00)
Glucose, Bld: 135 mg/dL — ABNORMAL HIGH (ref 70–99)
POTASSIUM: 4.2 meq/L (ref 3.5–5.1)
Sodium: 137 mEq/L (ref 135–145)

## 2014-03-27 ENCOUNTER — Encounter (INDEPENDENT_AMBULATORY_CARE_PROVIDER_SITE_OTHER): Payer: Medicare Other | Admitting: Ophthalmology

## 2014-03-27 DIAGNOSIS — H34833 Tributary (branch) retinal vein occlusion, bilateral: Secondary | ICD-10-CM

## 2014-03-27 DIAGNOSIS — H43811 Vitreous degeneration, right eye: Secondary | ICD-10-CM

## 2014-03-27 DIAGNOSIS — H35033 Hypertensive retinopathy, bilateral: Secondary | ICD-10-CM

## 2014-03-27 DIAGNOSIS — I10 Essential (primary) hypertension: Secondary | ICD-10-CM

## 2014-03-27 DIAGNOSIS — H43813 Vitreous degeneration, bilateral: Secondary | ICD-10-CM

## 2014-04-01 ENCOUNTER — Ambulatory Visit: Payer: Medicare Other | Admitting: Podiatry

## 2014-04-02 ENCOUNTER — Encounter: Payer: Self-pay | Admitting: Family Medicine

## 2014-04-02 ENCOUNTER — Ambulatory Visit (INDEPENDENT_AMBULATORY_CARE_PROVIDER_SITE_OTHER): Payer: Medicare Other | Admitting: Family Medicine

## 2014-04-02 VITALS — BP 110/69 | HR 103 | Temp 98.6°F | Resp 22 | Wt 152.0 lb

## 2014-04-02 DIAGNOSIS — R5383 Other fatigue: Secondary | ICD-10-CM

## 2014-04-02 DIAGNOSIS — R630 Anorexia: Secondary | ICD-10-CM

## 2014-04-02 DIAGNOSIS — D649 Anemia, unspecified: Secondary | ICD-10-CM | POA: Diagnosis not present

## 2014-04-02 DIAGNOSIS — I482 Chronic atrial fibrillation, unspecified: Secondary | ICD-10-CM

## 2014-04-02 LAB — CBC WITH DIFFERENTIAL/PLATELET
BASOS ABS: 0.1 10*3/uL (ref 0.0–0.1)
BASOS PCT: 0.6 % (ref 0.0–3.0)
EOS ABS: 0.1 10*3/uL (ref 0.0–0.7)
Eosinophils Relative: 0.9 % (ref 0.0–5.0)
HCT: 35 % — ABNORMAL LOW (ref 39.0–52.0)
HEMOGLOBIN: 11.1 g/dL — AB (ref 13.0–17.0)
Lymphocytes Relative: 10.2 % — ABNORMAL LOW (ref 12.0–46.0)
Lymphs Abs: 1.2 10*3/uL (ref 0.7–4.0)
MCHC: 31.8 g/dL (ref 30.0–36.0)
MCV: 91.2 fl (ref 78.0–100.0)
MONO ABS: 1 10*3/uL (ref 0.1–1.0)
Monocytes Relative: 8.6 % (ref 3.0–12.0)
NEUTROS PCT: 79.7 % — AB (ref 43.0–77.0)
Neutro Abs: 9.3 10*3/uL — ABNORMAL HIGH (ref 1.4–7.7)
PLATELETS: 295 10*3/uL (ref 150.0–400.0)
RBC: 3.84 Mil/uL — AB (ref 4.22–5.81)
RDW: 13.7 % (ref 11.5–15.5)
WBC: 11.7 10*3/uL — ABNORMAL HIGH (ref 4.0–10.5)

## 2014-04-02 LAB — COMPREHENSIVE METABOLIC PANEL
ALK PHOS: 76 U/L (ref 39–117)
ALT: 17 U/L (ref 0–53)
AST: 25 U/L (ref 0–37)
Albumin: 3.6 g/dL (ref 3.5–5.2)
BUN: 24 mg/dL — ABNORMAL HIGH (ref 6–23)
CO2: 32 mEq/L (ref 19–32)
Calcium: 9.3 mg/dL (ref 8.4–10.5)
Chloride: 94 mEq/L — ABNORMAL LOW (ref 96–112)
Creatinine, Ser: 0.88 mg/dL (ref 0.40–1.50)
GFR: 85.02 mL/min (ref >60.00)
GLUCOSE: 91 mg/dL (ref 70–99)
Potassium: 4.5 mEq/L (ref 3.5–5.1)
Sodium: 132 mEq/L — ABNORMAL LOW (ref 135–145)
TOTAL PROTEIN: 5.9 g/dL — AB (ref 6.0–8.3)
Total Bilirubin: 0.6 mg/dL (ref 0.2–1.2)

## 2014-04-02 LAB — POCT URINALYSIS DIPSTICK
BILIRUBIN UA: NEGATIVE
Glucose, UA: NEGATIVE
Ketones, UA: NEGATIVE
Nitrite, UA: NEGATIVE
PH UA: 6
Protein, UA: NEGATIVE
RBC UA: NEGATIVE
Spec Grav, UA: 1.01
Urobilinogen, UA: 0.2

## 2014-04-02 LAB — TSH: TSH: 3.87 u[IU]/mL (ref 0.35–4.50)

## 2014-04-02 MED ORDER — TAMSULOSIN HCL 0.4 MG PO CAPS: 0.4000 mg | ORAL_CAPSULE | Freq: Every day | ORAL | Status: DC

## 2014-04-02 MED ORDER — SIMVASTATIN 20 MG PO TABS: 20.0000 mg | ORAL_TABLET | Freq: Every day | ORAL | Status: DC

## 2014-04-02 MED ORDER — LOSARTAN POTASSIUM 50 MG PO TABS: 50.0000 mg | ORAL_TABLET | Freq: Every day | ORAL | Status: DC

## 2014-04-02 MED ORDER — ATENOLOL 25 MG PO TABS: 25.0000 mg | ORAL_TABLET | Freq: Every day | ORAL | Status: DC

## 2014-04-02 NOTE — Progress Notes (Signed)
Pre visit review using our clinic review tool, if applicable. No additional management support is needed unless otherwise documented below in the visit note. 

## 2014-04-02 NOTE — Progress Notes (Signed)
OFFICE NOTE  04/02/2014  CC:  Chief Complaint  Patient presents with  . Follow-up    not feeling well    HPI: Patient is a 81 y.o. Caucasian male who is here for approx 10 day period of feeling no energy and having no appetite/early satiety.  Denies SOB but does say he feels like he has to take deep breaths more often.  Denies pain anywhere. Denies palpitations or sensation of heart racing.  No recent fevers. Denies urinary c/o such as urgency, pain, or frequency.     Pertinent PMH:  Past medical, surgical, social, and family history reviewed and no changes are noted since last office visit.  MEDS:  Outpatient Prescriptions Prior to Visit  Medication Sig Dispense Refill  . Aspirin (ECOTRIN PO) Take 160 mg by mouth.    Marland Kitchen atenolol (TENORMIN) 25 MG tablet Take 1 tablet (25 mg total) by mouth daily. 30 tablet 3  . bimatoprost (LUMIGAN) 0.03 % ophthalmic solution 1 drop at bedtime.    . cyanocobalamin 2000 MCG tablet Take 2,500 mcg by mouth daily.    . furosemide (LASIX) 40 MG tablet 1/2 tab po qd 60 tablet 0  . losartan (COZAAR) 50 MG tablet Take 50 mg by mouth daily.    . Multiple Vitamin (MULTIVITAMIN) tablet Take 1 tablet by mouth daily.    . simvastatin (ZOCOR) 20 MG tablet Take 20 mg by mouth daily.    . tamsulosin (FLOMAX) 0.4 MG CAPS capsule Take 1 capsule (0.4 mg total) by mouth daily. 90 capsule 1  . azithromycin (ZITHROMAX) 250 MG tablet 2 tabs QD, then 1 tab QD x 4 days. 6 tablet 0   No facility-administered medications prior to visit.    PE: Blood pressure 110/69, pulse 103, temperature 98.6 F (37 C), temperature source Temporal, resp. rate 22, weight 152 lb (68.947 kg), SpO2 97 %. Gen: Alert, frail elderly WM sitting up in his WC, in NAD.  Patient is oriented to person, place, time, and situation. AFFECT: pleasant, lucid thought and speech. WUJ:WJXB: no injection, icteris, swelling, or exudate.  EOMI, PERRLA. Mouth: lips without lesion/swelling.  Oral mucosa pink  and moist. Oropharynx without erythema, exudate, or swelling.  Neck - No masses or thyromegaly or limitation in range of motion CV: Irreg Irreg, rate in the 80s-90s, 2/6 syst murmur: unchanged from prior cardiac exams. LUNGS: CTA bilat, nonlabored resps, no prolonged exp phase ABD: soft, NT/ND EXT: 1-2+ pitting in pretibial regions bilat, but ankles/feet with trace pitting only   LAB:  CC UA today showed trace LEU, o/w normal. Lab Results  Component Value Date   WBC 8.2 03/04/2014   HGB 12.3* 03/04/2014   HCT 38.4* 03/04/2014   MCV 92.4 03/04/2014   PLT 242.0 03/04/2014   Lab Results  Component Value Date   TSH 5.31* 03/04/2014     Chemistry      Component Value Date/Time   NA 137 03/16/2014 1113   K 4.2 03/16/2014 1113   CL 100 03/16/2014 1113   CO2 30 03/16/2014 1113   BUN 18 03/16/2014 1113   CREATININE 1.0 03/16/2014 1113   CREATININE 0.79 03/06/2014 1528      Component Value Date/Time   CALCIUM 9.1 03/16/2014 1113   ALKPHOS 71 03/04/2014 1211   AST 34 03/04/2014 1211   ALT 28 03/04/2014 1211   BILITOT 0.5 03/04/2014 1211      IMPRESSION AND PLAN:  1) Fatigue, recent decreased PO intake. His caregiver reports he has gone a  little overboard with his low Na diet so he has also diminished protein intake. He appears euvolemic on exam today. Will check CBC, CMET, TSH, and will send his urine for c/s. His UA today was minimally abnl: no meds started based on this since he has no symptoms directly related to  Urinary tract.  2) A fib, normal vent response/rate control: pt refuses anticoag other than ASA.   Will see what his electolytes look like, but with such a good low Na diet now, and with his volume status so good now, will likely d/c his SCHEDULED lasix completely.  An After Visit Summary was printed and given to the patient.  FOLLOW UP: prn (To be determined based on results of pending workup)

## 2014-04-03 LAB — IBC PANEL
Iron: 45 ug/dL (ref 42–165)
Saturation Ratios: 10.8 % — ABNORMAL LOW (ref 20.0–50.0)
Transferrin: 297 mg/dL (ref 212.0–360.0)

## 2014-04-03 LAB — FERRITIN: FERRITIN: 31.7 ng/mL (ref 22.0–322.0)

## 2014-04-03 NOTE — Addendum Note (Signed)
Addended by: Cydney OkAUGUSTIN, Margretta Zamorano N on: 04/03/2014 08:24 AM   Modules accepted: Orders

## 2014-04-04 LAB — URINE CULTURE
COLONY COUNT: NO GROWTH
Organism ID, Bacteria: NO GROWTH

## 2014-04-14 ENCOUNTER — Telehealth: Payer: Self-pay | Admitting: Family Medicine

## 2014-04-14 NOTE — Telephone Encounter (Signed)
Andrew Moses returned your call

## 2014-04-14 NOTE — Telephone Encounter (Signed)
The pt was supposed to have done hemoccults x 3.  Did pt/caregiver pick these up? If not, needs to get these.   Also needs repeat CBC. I think the best thing is for him to come back in to be seen (30 min office visit).   HOWEVER, If caregiver is feeling like he is in urgent need of attention then I strongly recommend she [facility].  This may be the best way to get a more extensive evaluation in a quick manner and get results back quick.  Then, any necessary therapeutic steps can be taken quicker as well.

## 2014-04-14 NOTE — Telephone Encounter (Signed)
[facility] pt is getting very confused, very weak, and sleeping a lot.  Caretaker says she needs help but doesn't know where to turn..  Please advise.

## 2014-04-14 NOTE — Telephone Encounter (Signed)
LMOM for Randa EvensJoanne to CB.

## 2014-04-15 ENCOUNTER — Other Ambulatory Visit: Payer: Medicare Other

## 2014-04-15 ENCOUNTER — Other Ambulatory Visit: Payer: Self-pay | Admitting: Family Medicine

## 2014-04-15 DIAGNOSIS — D649 Anemia, unspecified: Secondary | ICD-10-CM

## 2014-04-15 NOTE — Telephone Encounter (Signed)
Dr. Milinda CaveMcGowen spoke to [place].

## 2014-04-16 ENCOUNTER — Other Ambulatory Visit: Payer: Self-pay | Admitting: Family Medicine

## 2014-04-16 ENCOUNTER — Ambulatory Visit: Payer: Medicare Other | Admitting: Family Medicine

## 2014-04-16 ENCOUNTER — Encounter: Payer: Self-pay | Admitting: Family Medicine

## 2014-04-16 ENCOUNTER — Encounter: Payer: Medicare Other | Admitting: Family Medicine

## 2014-04-16 ENCOUNTER — Other Ambulatory Visit: Payer: Medicare Other

## 2014-04-16 VITALS — BP 110/60 | HR 160 | Resp 20

## 2014-04-16 DIAGNOSIS — R112 Nausea with vomiting, unspecified: Secondary | ICD-10-CM

## 2014-04-16 DIAGNOSIS — R630 Anorexia: Secondary | ICD-10-CM | POA: Insufficient documentation

## 2014-04-16 DIAGNOSIS — I4891 Unspecified atrial fibrillation: Secondary | ICD-10-CM | POA: Insufficient documentation

## 2014-04-16 DIAGNOSIS — R627 Adult failure to thrive: Secondary | ICD-10-CM

## 2014-04-16 DIAGNOSIS — R634 Abnormal weight loss: Secondary | ICD-10-CM

## 2014-04-16 DIAGNOSIS — D649 Anemia, unspecified: Secondary | ICD-10-CM | POA: Insufficient documentation

## 2014-04-16 DIAGNOSIS — R1084 Generalized abdominal pain: Secondary | ICD-10-CM

## 2014-04-16 DIAGNOSIS — R6881 Early satiety: Secondary | ICD-10-CM

## 2014-04-16 DIAGNOSIS — I482 Chronic atrial fibrillation, unspecified: Secondary | ICD-10-CM

## 2014-04-16 DIAGNOSIS — R1013 Epigastric pain: Secondary | ICD-10-CM | POA: Insufficient documentation

## 2014-04-16 MED ORDER — OMEPRAZOLE 40 MG PO CPDR: DELAYED_RELEASE_CAPSULE | ORAL | Status: AC

## 2014-04-16 NOTE — Progress Notes (Deleted)
OFFICE VISIT  04/16/2014   CC: No chief complaint on file.    HPI:    Patient is a 81 y.o. Caucasian male whom I visited 04/15/13 at his home for a house-call visit due to dwindling energy/failure to thrive.  Past Medical History  Diagnosis Date  . HTN (hypertension)   . Hyperlipidemia   . BPH (benign prostatic hypertrophy)   . Glaucoma   . Diverticulosis of colon     w/ hx of "itis"  . Macular degeneration     L eye with minimal vision  . Impaired mobility   . Chronic venous insufficiency   . Impaired vision in both eyes     L>>R  . Atrial fibrillation 01/06/2014    Pt states he does not want ANY procedures or anticoagulant treatment (other than ASA 162mg  daily)    Past Surgical History  Procedure Laterality Date  . Inguinal hernia repair  2012    bilat  . Hemorrhoid surgery      remote past    Outpatient Prescriptions Prior to Visit  Medication Sig Dispense Refill  . Aspirin (ECOTRIN PO) Take 160 mg by mouth.    Marland Kitchen. atenolol (TENORMIN) 25 MG tablet Take 1 tablet (25 mg total) by mouth daily. 90 tablet 3  . bimatoprost (LUMIGAN) 0.03 % ophthalmic solution 1 drop at bedtime.    . furosemide (LASIX) 40 MG tablet 1/2 tab po qd 60 tablet 0  . losartan (COZAAR) 50 MG tablet Take 1 tablet (50 mg total) by mouth daily. 90 tablet 3  . Multiple Vitamin (MULTIVITAMIN) tablet Take 1 tablet by mouth daily.    . simvastatin (ZOCOR) 20 MG tablet Take 1 tablet (20 mg total) by mouth daily. 90 tablet 3  . tamsulosin (FLOMAX) 0.4 MG CAPS capsule Take 1 capsule (0.4 mg total) by mouth daily. 90 capsule 3  . cyanocobalamin 2000 MCG tablet Take 2,500 mcg by mouth daily.    . NON FORMULARY Prostalex Plus supplement.     No facility-administered medications prior to visit.    No Known Allergies  ROS As per HPI  PE: Blood pressure 110/70, pulse 170, resp. rate 20. ***  LABS:  ***  IMPRESSION AND PLAN:  No problem-specific assessment & plan notes found for this  encounter.   FOLLOW UP: Return if symptoms worsen or fail to improve.

## 2014-04-16 NOTE — Progress Notes (Addendum)
OFFICE VISIT  04/15/14  CC: failure to thrive  HPI:    Patient is a 81 y.o. Caucasian male who I did home visit/house call for 04/15/14 for pt's failure to thrive. Last 3 wks pt has had the dwindles and has not let his caregiver bring him to MD/ED. Apparently he cries out frequently about pain in upper abd/chest area/moans all throughout the day, is very regimental about what/when he eats, and when he tries to eat it is apparent he is not hungry and he forces the food down.  On several occasions he has regurgitated/vomited food before he could really get it into stomach.  Caregiver says on one of these occasions the vomitus had blood in it.  He has not been coughing or having SOB.  No fevers.  He feels full extremely quickly. Caregiver reports he has essentially stayed in bed and acted confused more and more the last week or so.  Getting him to take his meds is hit or miss lately.  BM's care normal appearing per pt and occuring once a day.  He is urinating normally per pt and caregiver says the color of the urine is light/clear yellow.   I briefly talked to him on speaker phone before coming to his home today and his caregiver says he quickly "straightened up" and became lucid and alert.  Family/caregivers maintain that he seems to be giving up, but he agreed to let me come over to take a look at him to see if any measures can be taken to help him feel better and also to discuss issues such as healthcare POA and hospice possibility.  There is definitely caregiver burnout that is evident.  ROS: denies palpitations/heart racing sensation, denies focal weakness, denies dysphagia or odynophagia, denies lower abd pain, denies musculoskeletal pain.  He has chronic LE swelling that is relatively unchanged over the last 1-2 wks.  No URI, ST, cough, wheezing, flank pain, or HAs or dizziness.    Past Medical History  Diagnosis Date  . HTN (hypertension)   . Hyperlipidemia   . BPH (benign prostatic  hypertrophy)   . Glaucoma   . Diverticulosis of colon     w/ hx of "itis"  . Macular degeneration     L eye with minimal vision  . Impaired mobility   . Chronic venous insufficiency   . Impaired vision in both eyes     L>>R  . Atrial fibrillation 01/06/2014    Pt states he does not want ANY procedures or anticoagulant treatment (other than ASA  daily)    Past Surgical History  Procedure Laterality Date  . Inguinal hernia repair  2012    bilat  . Hemorrhoid surgery      remote past    Outpatient Prescriptions Prior to Visit  Medication Sig Dispense Refill  . Aspirin (ECOTRIN PO) Take 160 mg by mouth.    Marland Kitchen atenolol (TENORMIN) 25 MG tablet Take 1 tablet (25 mg total) by mouth daily. 90 tablet 3  . bimatoprost (LUMIGAN) 0.03 % ophthalmic solution 1 drop at bedtime.    . furosemide (LASIX) 40 MG tablet 1/2 tab po qd 60 tablet 0  . losartan (COZAAR) 50 MG tablet Take 1 tablet (50 mg total) by mouth daily. 90 tablet 3  . tamsulosin (FLOMAX) 0.4 MG CAPS capsule Take 1 capsule (0.4 mg total) by mouth daily. 90 capsule 3  . cyanocobalamin 2000 MCG tablet Take 2,500 mcg by mouth daily.    . Multiple Vitamin (  MULTIVITAMIN) tablet Take 1 tablet by mouth daily.    . NON FORMULARY Prostalex Plus supplement.    Marland Kitchen. omeprazole (PRILOSEC) 40 MG capsule One tab PO QAM (Patient not taking: Reported on 04/16/2014) 30 capsule 3  . simvastatin (ZOCOR) 20 MG tablet Take 1 tablet (20 mg total) by mouth daily. (Patient not taking: Reported on 04/16/2014) 90 tablet 3   No facility-administered medications prior to visit.    No Known Allergies  ROS As per HPI  PE: Blood pressure 110/60, pulse 160, resp. rate 20. Gen: alert, disheveled-appearing.  Oriented to person, place, situation. He displayed relatively lucid thought and speech. When I spoke with him he made good eye contact and focused on me and remained calm. When I was speaking with the family in a separate part of the house he began  loud incessant moaning. ENT: eyes without drainage or swelling.  No icterus. Mouth: mucosa pink, moist.  Lips are not cracked. CV: Irreg irreg, rate estimated at 160-180, soft systolic murmur.  No rub. LUNGS: CTA bilat, nonlabored resps. ABD: soft, nondistended, nontender to palpation.  I cannot feel any HSM or mass.  No bruit. BS normal. EXT: no clubbing or cyanosis.  He has 2+ pitting edema from mid tibia level down into feet bilat. He could sit up w/out assistance.  He required assistance to stand and to ambulate.  LABS:  None today RECENT:   Chemistry      Component Value Date/Time   NA 132* 04/02/2014 1155   K 4.5 04/02/2014 1155   CL 94* 04/02/2014 1155   CO2 32 04/02/2014 1155   BUN 24* 04/02/2014 1155   CREATININE 0.88 04/02/2014 1155   CREATININE 0.79 03/06/2014 1528      Component Value Date/Time   CALCIUM 9.3 04/02/2014 1155   ALKPHOS 76 04/02/2014 1155   AST 25 04/02/2014 1155   ALT 17 04/02/2014 1155   BILITOT 0.6 04/02/2014 1155     Lab Results  Component Value Date   WBC 11.7* 04/02/2014   HGB 11.1* 04/02/2014   HCT 35.0* 04/02/2014   MCV 91.2 04/02/2014   PLT 295.0 04/02/2014   Lab Results  Component Value Date   TSH 3.87 04/02/2014   Lab Results  Component Value Date   IRON 45 04/03/2014   FERRITIN 31.7 04/03/2014   Hemoccults x 3 04/15/13: all 3 were negative  IMPRESSION AND PLAN:  This patient is an elderly, frail man who I think has decided to quit trying to live. My initial recommendations to the family when they reported his situation to me on the phone was [facility] and have him get a w/u, possibly get admitted to hosp, etc. However, patient is repeatedly stating he does not want this type of intervention, nor does he want "lots of tests".   His main dx at this time are adult FTT, anorexia with suspected epigastric pain/question of nausea, abnormal wt loss (16 lb in the last 1 mo), anemia due to unknown cause (Hb drop 12.3  to 11.1 over the course of 69mo, but hemoccults neg x 3), and atrial fibrillation with RVR. He has long refused anticoagulation other than ASA, so we will continue this.  We'll slowly increase his atenolol (increase to 1 and 1/2 of the 25mg  tabs now) in attempt to control HR better but have to be careful not to drop his bp.  He is surprisingly tolerant of his elevated HR at this time.  After  much discussion today, Andrew Moses agree to proceed with additional blood work and imaging (to take place tomorrow 04/16/13): acute abd series, CBC, CMET, lipase, and CRP. Will start prilosec  once a day.  Due to poor PO intake and low normal bp, hold lasix and losartan. I recommended he d/c simvastatin as it is certainly >risk than benefit at this point.  If w/u is unrevealing or if w/u leads to further testing that reveals dx that is untreatable, he has voiced his willingness to have home hospice consult. His healthcare POA is his son, Andrew Moses, who lives in [place], [place].   I have his contact # so I'll call and inform him of general status/plan. Of note, he faxed a copy of pt's living will, which states he wants "DNR" and no heroic measures and essentially wants only to be made comfortable in his time of dying (no IVF or tube feeding).  Spent 60 min with pt today, with >50% of this time spent in counseling and care coordination regarding the above problems.  FOLLOW UP: To be determined based on results of pending workup.  ADDENDUM 04/16/14:   Pt. Refusing to get out of bed to go get the blood and imaging tests we planned/ordered for today at [facility].  Will continue with current medication plan (atenolol , 1 and 1/2 tabs qd, ASA 162 mg qd, and omeprazole  qd) and will proceed with hospice referral.  --PM

## 2014-04-17 ENCOUNTER — Other Ambulatory Visit: Payer: Self-pay | Admitting: Family Medicine

## 2014-04-17 DIAGNOSIS — I4891 Unspecified atrial fibrillation: Secondary | ICD-10-CM

## 2014-04-17 DIAGNOSIS — R627 Adult failure to thrive: Secondary | ICD-10-CM

## 2014-04-17 DIAGNOSIS — R634 Abnormal weight loss: Secondary | ICD-10-CM

## 2014-04-17 DIAGNOSIS — R1013 Epigastric pain: Secondary | ICD-10-CM

## 2014-04-17 DIAGNOSIS — R630 Anorexia: Secondary | ICD-10-CM

## 2014-04-18 ENCOUNTER — Emergency Department (HOSPITAL_COMMUNITY)
Admission: EM | Admit: 2014-04-18 | Discharge: 2014-04-19 | Disposition: A | Discharge status: Home / Self Care | Payer: Medicare Other | Attending: Emergency Medicine | Admitting: Emergency Medicine

## 2014-04-18 ENCOUNTER — Encounter (HOSPITAL_COMMUNITY): Payer: Self-pay | Admitting: Emergency Medicine

## 2014-04-18 ENCOUNTER — Emergency Department (HOSPITAL_COMMUNITY): Payer: Medicare Other

## 2014-04-18 DIAGNOSIS — J159 Unspecified bacterial pneumonia: Secondary | ICD-10-CM | POA: Diagnosis not present

## 2014-04-18 DIAGNOSIS — H409 Unspecified glaucoma: Secondary | ICD-10-CM | POA: Insufficient documentation

## 2014-04-18 DIAGNOSIS — Z792 Long term (current) use of antibiotics: Secondary | ICD-10-CM | POA: Diagnosis not present

## 2014-04-18 DIAGNOSIS — Z79899 Other long term (current) drug therapy: Secondary | ICD-10-CM | POA: Insufficient documentation

## 2014-04-18 DIAGNOSIS — Z87891 Personal history of nicotine dependence: Secondary | ICD-10-CM | POA: Insufficient documentation

## 2014-04-18 DIAGNOSIS — E785 Hyperlipidemia, unspecified: Secondary | ICD-10-CM | POA: Insufficient documentation

## 2014-04-18 DIAGNOSIS — I10 Essential (primary) hypertension: Secondary | ICD-10-CM | POA: Diagnosis not present

## 2014-04-18 DIAGNOSIS — Z87448 Personal history of other diseases of urinary system: Secondary | ICD-10-CM | POA: Insufficient documentation

## 2014-04-18 DIAGNOSIS — Z8719 Personal history of other diseases of the digestive system: Secondary | ICD-10-CM | POA: Insufficient documentation

## 2014-04-18 DIAGNOSIS — R509 Fever, unspecified: Secondary | ICD-10-CM | POA: Diagnosis present

## 2014-04-18 DIAGNOSIS — J189 Pneumonia, unspecified organism: Secondary | ICD-10-CM

## 2014-04-18 LAB — URINALYSIS, ROUTINE W REFLEX MICROSCOPIC
Bilirubin Urine: NEGATIVE
Glucose, UA: NEGATIVE mg/dL
Hgb urine dipstick: NEGATIVE
Ketones, ur: NEGATIVE mg/dL
NITRITE: NEGATIVE
PH: 7 (ref 5.0–8.0)
PROTEIN: NEGATIVE mg/dL
SPECIFIC GRAVITY, URINE: 1.016 (ref 1.005–1.030)
UROBILINOGEN UA: 0.2 mg/dL (ref 0.0–1.0)

## 2014-04-18 LAB — COMPREHENSIVE METABOLIC PANEL
ALBUMIN: 3.2 g/dL — AB (ref 3.5–5.2)
ALT: 21 U/L (ref 0–53)
AST: 34 U/L (ref 0–37)
Alkaline Phosphatase: 81 U/L (ref 39–117)
Anion gap: 8 (ref 5–15)
BUN: 21 mg/dL (ref 6–23)
CALCIUM: 8.7 mg/dL (ref 8.4–10.5)
CO2: 27 mmol/L (ref 19–32)
Chloride: 96 mmol/L (ref 96–112)
Creatinine, Ser: 0.84 mg/dL (ref 0.50–1.35)
GFR calc Af Amer: 82 mL/min — ABNORMAL LOW (ref >90)
GFR, EST NON AFRICAN AMERICAN: 71 mL/min — AB (ref >90)
GLUCOSE: 98 mg/dL (ref 70–99)
POTASSIUM: 3.9 mmol/L (ref 3.5–5.1)
SODIUM: 131 mmol/L — AB (ref 135–145)
TOTAL PROTEIN: 6 g/dL (ref 6.0–8.3)
Total Bilirubin: 0.7 mg/dL (ref 0.3–1.2)

## 2014-04-18 LAB — CBC WITH DIFFERENTIAL/PLATELET
BASOS ABS: 0 10*3/uL (ref 0.0–0.1)
BASOS PCT: 0 % (ref 0–1)
EOS ABS: 0.1 10*3/uL (ref 0.0–0.7)
Eosinophils Relative: 1 % (ref 0–5)
HCT: 34.3 % — ABNORMAL LOW (ref 39.0–52.0)
HEMOGLOBIN: 11.1 g/dL — AB (ref 13.0–17.0)
LYMPHS ABS: 1.2 10*3/uL (ref 0.7–4.0)
Lymphocytes Relative: 11 % — ABNORMAL LOW (ref 12–46)
MCH: 29.1 pg (ref 26.0–34.0)
MCHC: 32.4 g/dL (ref 30.0–36.0)
MCV: 90 fL (ref 78.0–100.0)
MONOS PCT: 10 % (ref 3–12)
Monocytes Absolute: 1.1 10*3/uL — ABNORMAL HIGH (ref 0.1–1.0)
Neutro Abs: 8.8 10*3/uL — ABNORMAL HIGH (ref 1.7–7.7)
Neutrophils Relative %: 78 % — ABNORMAL HIGH (ref 43–77)
Platelets: 304 10*3/uL (ref 150–400)
RBC: 3.81 MIL/uL — ABNORMAL LOW (ref 4.22–5.81)
RDW: 13.5 % (ref 11.5–15.5)
WBC: 11.2 10*3/uL — AB (ref 4.0–10.5)

## 2014-04-18 LAB — LACTIC ACID, PLASMA: LACTIC ACID, VENOUS: 1.8 mmol/L (ref 0.5–2.0)

## 2014-04-18 LAB — URINE MICROSCOPIC-ADD ON

## 2014-04-18 MED ORDER — AZITHROMYCIN 250 MG PO TABS: ORAL_TABLET | ORAL | Status: DC

## 2014-04-18 MED ORDER — AZITHROMYCIN 250 MG PO TABS
500.0000 mg | ORAL_TABLET | Freq: Once | ORAL | Status: AC
  Administered 2014-04-18: 500 mg via ORAL
  Filled 2014-04-18: qty 2

## 2014-04-18 NOTE — ED Notes (Signed)
Bed: [facility] Expected date:  Expected time:  Means of arrival:  Comments: 

## 2014-04-18 NOTE — ED Notes (Signed)
Awake. Verbally responsive. A/O x4. Resp even and unlabored. No audible adventitious breath sounds noted. ABC's intact.  

## 2014-04-18 NOTE — Discharge Instructions (Signed)
Fever, Adult A fever is a temperature of 100.4 F (38 C) or above.  HOME CARE  Take fever medicine as told by your doctor. Do not  take aspirin for fever if you are younger than 79 years of age.  If you are given antibiotic medicine, take it as told. Finish the medicine even if you start to feel better.  Rest.  Drink enough fluids to keep your pee (urine) clear or pale yellow. Do not drink alcohol.  Take a bath or shower with room temperature water. Do not use ice water or alcohol sponge baths.  Wear lightweight, loose clothes. GET HELP RIGHT AWAY IF:   You are short of breath or have trouble breathing.  You are very weak.  You are dizzy or you pass out (faint).  You are very thirsty or are making little or no urine.  You have new pain.  You throw up (vomit) or have watery poop (diarrhea).  You keep throwing up or having watery poop for more than 1 to 2 days.  You have a stiff neck or light bothers your eyes.  You have a skin rash.  You have a fever or problems (symptoms) that last for more than 2 to 3 days.  You have a fever and your problems quickly get worse.  You keep throwing up the fluids you drink.  You do not feel better after 3 days.  You have new problems. MAKE SURE YOU:   Understand these instructions.  Will watch your condition.  Will get help right away if you are not doing well or get worse. Document Released: 12/14/2007 Document Revised: 05/29/2011 Document Reviewed: 01/05/2011 [facility] Patient Information 2015 [employer] [place]. This information is not intended to replace advice given to you by your health care provider. Make sure you discuss any questions you have with your health care provider.  Pneumonia Pneumonia is an infection of the lungs.  CAUSES Pneumonia may be caused by bacteria or a virus. Usually, these infections are caused by breathing infectious particles into the lungs (respiratory tract). SIGNS AND SYMPTOMS    Cough.  Fever.  Chest pain.  Increased rate of breathing.  Wheezing.  Mucus production. DIAGNOSIS  If you have the common symptoms of pneumonia, your health care provider will typically confirm the diagnosis with a chest X-ray. The X-ray will show an abnormality in the lung (pulmonary infiltrate) if you have pneumonia. Other tests of your blood, urine, or sputum may be done to find the specific cause of your pneumonia. Your health care provider may also do tests (blood gases or pulse oximetry) to see how well your lungs are working. TREATMENT  Some forms of pneumonia may be spread to other people when you cough or sneeze. You may be asked to wear a mask before and during your exam. Pneumonia that is caused by bacteria is treated with antibiotic medicine. Pneumonia that is caused by the influenza virus may be treated with an antiviral medicine. Most other viral infections must run their course. These infections will not respond to antibiotics.  HOME CARE INSTRUCTIONS   Cough suppressants may be used if you are losing too much rest. However, coughing protects you by clearing your lungs. You should avoid using cough suppressants if you can.  Your health care provider may have prescribed medicine if he or she thinks your pneumonia is caused by bacteria or influenza. Finish your medicine even if you start to feel better.  Your health care provider may also prescribe an  expectorant. This loosens the mucus to be coughed up.  Take medicines only as directed by your health care provider.  Do not smoke. Smoking is a common cause of bronchitis and can contribute to pneumonia. If you are a smoker and continue to smoke, your cough may last several weeks after your pneumonia has cleared.  A cold steam vaporizer or humidifier in your room or home may help loosen mucus.  Coughing is often worse at night. Sleeping in a semi-upright position in a recliner or using a couple pillows under your head will  help with this.  Get rest as you feel it is needed. Your body will usually let you know when you need to rest. PREVENTION A pneumococcal shot (vaccine) is available to prevent a common bacterial cause of pneumonia. This is usually suggested for:  People over 5 years old.  Patients on chemotherapy.  People with chronic lung problems, such as bronchitis or emphysema.  People with immune system problems. If you are over 65 or have a high risk condition, you may receive the pneumococcal vaccine if you have not received it before. In some countries, a routine influenza vaccine is also recommended. This vaccine can help prevent some cases of pneumonia.You may be offered the influenza vaccine as part of your care. If you smoke, it is time to quit. You may receive instructions on how to stop smoking. Your health care provider can provide medicines and counseling to help you quit. SEEK MEDICAL CARE IF: You have a fever. SEEK IMMEDIATE MEDICAL CARE IF:   Your illness becomes worse. This is especially true if you are elderly or weakened from any other disease.  You cannot control your cough with suppressants and are losing sleep.  You begin coughing up blood.  You develop pain which is getting worse or is uncontrolled with medicines.  Any of the symptoms which initially brought you in for treatment are getting worse rather than better.  You develop shortness of breath or chest pain. MAKE SURE YOU:   Understand these instructions.  Will watch your condition.  Will get help right away if you are not doing well or get worse. Document Released: 03/06/2005 Document Revised: 07/21/2013 Document Reviewed: 05/26/2010 [facility] Patient Information 2015 [place], [place]. This information is not intended to replace advice given to you by your health care provider. Make sure you discuss any questions you have with your health care provider.

## 2014-04-18 NOTE — ED Provider Notes (Signed)
CSN: [id number]     Arrival date & time 04/18/14  4098 History   First MD Initiated Contact with Patient 04/18/14 1911     Chief Complaint  Patient presents with  . Altered Mental Status  . Fever      HPI Pt is from home. Pt's family wants blood work and an x-ray done.. Pt has been agitated for about a week per family. Pt had fever 102.2 per EMS.  Patient has had a cough. Past Medical History  Diagnosis Date  . HTN (hypertension)   . Hyperlipidemia   . BPH (benign prostatic hypertrophy)   . Glaucoma   . Diverticulosis of colon     w/ hx of "itis"  . Macular degeneration     L eye with minimal vision  . Impaired mobility   . Chronic venous insufficiency   . Impaired vision in both eyes     L>>R  . Atrial fibrillation 01/06/2014    Pt states he does not want ANY procedures or anticoagulant treatment (other than ASA  daily)   Past Surgical History  Procedure Laterality Date  . Inguinal hernia repair  2012    bilat  . Hemorrhoid surgery      remote past   No family history on file. History  Substance Use Topics  . Smoking status: Former Games developer  . Smokeless tobacco: Never Used  . Alcohol Use: Yes     Comment: 4-5 oz daily    Review of Systems  All other systems reviewed and are negative  Allergies  Review of patient's allergies indicates no known allergies.  Home Medications   Prior to Admission medications   Medication Sig Start Date End Date Taking? Authorizing Provider  Aspirin (ECOTRIN PO) Take 160 mg by mouth daily.    Yes Historical Provider, MD  atenolol (TENORMIN) 25 MG tablet Take 37.5 mg by mouth daily. Take 1 & 1/2 Tablets (37.5 mg) By Mouth Daily.   Yes Historical Provider, MD  bimatoprost (LUMIGAN) 0.03 % ophthalmic solution 1 drop at bedtime.   Yes Historical Provider, MD  omeprazole (PRILOSEC) 40 MG capsule One tab PO QAM 04/16/14  Yes Jeoffrey Massed, MD  simvastatin (ZOCOR) 20 MG tablet Take 1 tablet (20 mg total) by mouth daily. 04/02/14   Yes Jeoffrey Massed, MD  atenolol (TENORMIN) 25 MG tablet Take 1 tablet (25 mg total) by mouth daily. Patient not taking: Reported on 04/18/2014 04/02/14   Jeoffrey Massed, MD  azithromycin [facility]) 250 MG tablet Take 1 pill daily until gone 04/18/14   Nelia Shi, MD  furosemide (LASIX) 40 MG tablet 1/2 tab po qd 03/06/14   Jeoffrey Massed, MD  losartan (COZAAR) 50 MG tablet Take 1 tablet (50 mg total) by mouth daily. Patient not taking: Reported on 04/18/2014 04/02/14   Jeoffrey Massed, MD   BP 109/82 mmHg  Pulse 105  Temp(Src) 98.7 F (37.1 C) (Oral)  Resp 18  Ht  (1.575 m)  Wt 150 lb (68.04 kg)  BMI 27.43 kg/m2  SpO2 97% Physical Exam  Constitutional: He is oriented to person, place, and time. He appears well-developed and well-nourished. No distress.  HENT:  Head: Normocephalic and atraumatic.  Eyes: Pupils are equal, round, and reactive to light.  Neck: Normal range of motion.  Cardiovascular: Normal rate and intact distal pulses.   Pulmonary/Chest: No respiratory distress.    Abdominal: Normal appearance. He exhibits no distension.  Musculoskeletal: Normal range of motion.  Neurological:  He is alert and oriented to person, place, and time. No cranial nerve deficit.  Skin: Skin is warm and dry. No rash noted.  Psychiatric: He has a normal mood and affect. His behavior is normal.  Nursing note and vitals reviewed.   ED Course  Procedures (including critical care time) Medications  azithromycin (ZITHROMAX) tablet 500 mg (500 mg Oral Given 04/18/14 2203)    Labs Review Labs Reviewed  CBC WITH DIFFERENTIAL/PLATELET - Abnormal; Notable for the following:    WBC 11.2 (*)    RBC 3.81 (*)    Hemoglobin 11.1 (*)    HCT 34.3 (*)    Neutrophils Relative % 78 (*)    Neutro Abs 8.8 (*)    Lymphocytes Relative 11 (*)    Monocytes Absolute 1.1 (*)    All other components within normal limits  COMPREHENSIVE METABOLIC PANEL - Abnormal; Notable for the following:      Sodium 131 (*)    Albumin 3.2 (*)    GFR calc non Af Amer 71 (*)    GFR calc Af Amer 82 (*)    All other components within normal limits  URINALYSIS, ROUTINE W REFLEX MICROSCOPIC - Abnormal; Notable for the following:    APPearance CLOUDY (*)    Leukocytes, UA SMALL (*)    All other components within normal limits  URINE MICROSCOPIC-ADD ON - Abnormal; Notable for the following:    Bacteria, UA FEW (*)    All other components within normal limits  URINE CULTURE  CULTURE, BLOOD (ROUTINE X 2)  CULTURE, BLOOD (ROUTINE X 2)  LACTIC ACID, PLASMA    Imaging Review      DG Chest 2 View (Final result) Result time: 04/18/14 20:05:32   Final result by Rad Results In Interface (04/18/14 20:05:32)   Narrative:   CLINICAL DATA: Agitation for 1 week. Atrial fibrillation and hypertension  EXAM: CHEST 2 VIEW  COMPARISON: March 04, 2014  FINDINGS: There is underlying emphysematous change. There is patchy infiltrate in the posterior right base. Elsewhere lungs are clear. Heart is upper normal in size with pulmonary vascularity within normal limits. There is atherosclerotic change in the aorta. No adenopathy. There is arthropathy in each shoulder.  IMPRESSION: Patchy infiltrate posterior right base. Underlying emphysema.   Electronically Signed By: Bretta BangWilliam Woodruff M.D. On: 04/18/2014 20:05     EKG Interpretation None     Patient was given the option of staying in the hospital or being treated as outpatient.  He was adamant about being treated as outpatient.  At this time he is not hypoxic.  Vital signs.  Normal knee satting 98% on room air.  He is stable for discharge. MDM   Final diagnoses:  Fever  CAP (community acquired pneumonia)        Nelia Shiobert L Alnisa Hasley, MD 04/22/14 2200

## 2014-04-18 NOTE — ED Notes (Signed)
Pt BIB EMS. Pt is from home. Pt's family wants pt to be placed in Hospice per EMS. Pt has been agitated for about a week per family. Pt had fever 102.2 per EMS. Pt had a 700 ml NS bolus en route from EMS.

## 2014-04-19 NOTE — ED Notes (Signed)
Called [facility] for transport to home. °

## 2014-04-20 LAB — HEMOCCULT SLIDES (X 3 CARDS)
Fecal Occult Blood: NEGATIVE
OCCULT 1: NEGATIVE
OCCULT 2: NEGATIVE
OCCULT 3: NEGATIVE
OCCULT 4: NEGATIVE
OCCULT 5: NEGATIVE

## 2014-04-20 LAB — URINE CULTURE
Colony Count: 25000
Special Requests: NORMAL

## 2014-04-23 ENCOUNTER — Ambulatory Visit: Payer: Medicare Other | Admitting: Podiatry

## 2014-04-23 ENCOUNTER — Ambulatory Visit: Payer: Medicare Other | Admitting: Family Medicine

## 2014-04-24 ENCOUNTER — Other Ambulatory Visit: Payer: Self-pay | Admitting: Family Medicine

## 2014-04-24 ENCOUNTER — Encounter (INDEPENDENT_AMBULATORY_CARE_PROVIDER_SITE_OTHER): Payer: Medicare Other | Admitting: Ophthalmology

## 2014-04-24 MED ORDER — LORAZEPAM 0.5 MG PO TABS: ORAL_TABLET | ORAL | Status: AC

## 2014-04-25 LAB — CULTURE, BLOOD (ROUTINE X 2)
Culture: NO GROWTH
Culture: NO GROWTH

## 2014-04-27 ENCOUNTER — Telehealth: Payer: Self-pay | Admitting: Family Medicine

## 2014-04-27 NOTE — Telephone Encounter (Signed)
Patient's son is requesting a CB from Dr. Milinda CaveMcGowen, he has more info about his dad.

## 2014-04-27 NOTE — Telephone Encounter (Signed)
I talked to Renae FicklePaul, Mr. Welle's son, and it looks like we are back to square one. In other words, we need to call [facility] back and ask them to go back out there to do consult and they also need to do the social worker aspect of things so he can be placed in a nursing home.

## 2014-04-28 NOTE — Telephone Encounter (Signed)
Sent staff message [facility] [place] [facility].  Awaiting reply.

## 2014-04-28 NOTE — Telephone Encounter (Signed)
Andrew Moses said the old referral should work.  She will contact us if she has any problems.

## 2014-04-28 NOTE — Telephone Encounter (Signed)
Noted  

## 2014-05-01 DIAGNOSIS — Z0279 Encounter for issue of other medical certificate: Secondary | ICD-10-CM

## 2014-05-01 NOTE — Telephone Encounter (Signed)
Increase tamsulosin to TWO of the 0.4 mg tabs at bedtime every night.

## 2014-05-01 NOTE — Telephone Encounter (Signed)
Randa EvensJoanne was notified.

## 2014-05-01 NOTE — Telephone Encounter (Signed)
Patient is getting up frequently at night to urinate however sometimes it is only a "dribble". Is the medication Tamsulosin to help with overnight frequent urges? Can this medication be increased or should it be taken at a different time of day? He is currently taking this at bedtime. Please advise.

## 2014-05-04 ENCOUNTER — Encounter: Payer: Self-pay | Admitting: Family Medicine

## 2014-05-05 ENCOUNTER — Telehealth: Payer: Self-pay | Admitting: Family Medicine

## 2014-05-05 NOTE — Telephone Encounter (Signed)
Patient is inquiring about the "Renette ButtersGolden Form". EMS mentioned this form to her the other day when they were at her house for the patient. Are you aware of what form they are talking about?

## 2014-05-05 NOTE — Telephone Encounter (Signed)
I think she is talking about the yellow/gold piece of paper that has a 'stop sign' and 'Do Not Resuscitate' written on it. It can be signed by the patient and/or [facility] POA and a copy can be kept in his chart and original can be kept at pt's home and be available for them to show EMS or bring with them to the hospital/ED if they have to go in the future. If we don't have them at the office, pls call [name] and ask where we can get some.  Thx!

## 2014-05-05 NOTE — Telephone Encounter (Signed)
Please advise 

## 2014-05-07 NOTE — Telephone Encounter (Signed)
I checked with [name] & he said Andrew Moses has DNR paperwork that she can bring out to our practice. She has offerred to come out & meet with you & Layne to go over the paperwork. Do you want me to set something up?

## 2014-05-10 NOTE — Telephone Encounter (Signed)
No thanks, I just want the yellow/gold DNR forms.-thx

## 2014-05-11 ENCOUNTER — Ambulatory Visit (INDEPENDENT_AMBULATORY_CARE_PROVIDER_SITE_OTHER): Payer: Medicare Other | Admitting: Family Medicine

## 2014-05-11 ENCOUNTER — Telehealth: Payer: Self-pay | Admitting: Family Medicine

## 2014-05-11 DIAGNOSIS — Z111 Encounter for screening for respiratory tuberculosis: Secondary | ICD-10-CM

## 2014-05-11 NOTE — Telephone Encounter (Signed)
Patient has so much mucus he is throwing up his food. Patient's caregiver wants to know if Dr. Milinda CaveMcGowen would like to see him for OV today.

## 2014-05-11 NOTE — Telephone Encounter (Signed)
Patient's son needs a hardship letter for the airlines which will allow him to change his return flight due to him not knowing when he will be done working with his dad's health situation. He has been nice enough to compose a letter for you, please use his and make any necessary changes if you need to:          To Whom it May Concern:    This letter is to certify that Rebekah Chesterfieldaul Micios father was in critical condition earlier this month and that he [place] in from [place] [place] to [place] [address] [place] [employer]), in order to attend to his father. Renae Fickleaul is now preparing to get his father settled into a local assisted living center before going back home to [place] on the open return ticket that your airline accorded him.  Please contact him on his cell [phone]. He will need to pick this up ASAP. Thanks

## 2014-05-11 NOTE — Telephone Encounter (Signed)
Caregiver called back, patient is better.

## 2014-05-12 ENCOUNTER — Encounter: Payer: Self-pay | Admitting: Family Medicine

## 2014-05-12 NOTE — Telephone Encounter (Signed)
I have printed this letter. I will sign tomorrow and then Renae Fickleaul can come pick it up.-thx

## 2014-05-13 ENCOUNTER — Encounter: Payer: Self-pay | Admitting: Family Medicine

## 2014-05-13 ENCOUNTER — Ambulatory Visit (INDEPENDENT_AMBULATORY_CARE_PROVIDER_SITE_OTHER): Payer: Medicare Other | Admitting: Family Medicine

## 2014-05-13 VITALS — BP 112/70 | HR 91

## 2014-05-13 DIAGNOSIS — Z7409 Other reduced mobility: Secondary | ICD-10-CM

## 2014-05-13 DIAGNOSIS — R627 Adult failure to thrive: Secondary | ICD-10-CM

## 2014-05-13 DIAGNOSIS — R634 Abnormal weight loss: Secondary | ICD-10-CM

## 2014-05-13 DIAGNOSIS — I4891 Unspecified atrial fibrillation: Secondary | ICD-10-CM

## 2014-05-13 LAB — TB SKIN TEST
INDURATION: 0 mm
TB Skin Test: NEGATIVE

## 2014-05-13 NOTE — Progress Notes (Signed)
Pre visit review using our clinic review tool, if applicable. No additional management support is needed unless otherwise documented below in the visit note. 

## 2014-05-13 NOTE — Progress Notes (Signed)
OFFICE VISIT  05/13/2014   CC: "follow up, discuss ALF placement"  HPI:    Patient is a 81 y.o. Caucasian male who presents for f/u a-fib, chronic venous insufficiency, BPH, and adult failure to thrive. No recent significant changes in status except the son, Andrew Moses, tells me he still has some intermittent swallowing troubles and his stomach is very sensitive when eating and when taking meds. Pt states he does not want any more pills that what is absolutely thought to be essential by me. His current caregiver, Andrew Moses, and his son Andrew Moses are here today and have asked me to tell the patient that it is time to go to an ALF.  They have completed all the arrangements for him to go to [facility].  ROS: no CP, no SOB, no abd pain, some nausea but no vomiting  Past Medical History  Diagnosis Date  . HTN (hypertension)   . Hyperlipidemia   . BPH (benign prostatic hypertrophy)   . Glaucoma   . Diverticulosis of colon     w/ hx of "itis"  . Macular degeneration     L eye with minimal vision  . Impaired mobility   . Chronic venous insufficiency   . Impaired vision in both eyes     L>>R  . Atrial fibrillation 01/06/2014    Pt states he does not want ANY procedures or anticoagulant treatment (other than ASA 162mg  daily)  . COPD (chronic obstructive pulmonary disease)     radiographically    Past Surgical History  Procedure Laterality Date  . Inguinal hernia repair  2012    bilat  . Hemorrhoid surgery      remote past   MEDS: Not taking azith, furosemide, cozaar, or zocor listed below Outpatient Prescriptions Prior to Visit  Medication Sig Dispense Refill  . Aspirin (ECOTRIN PO) Take 160 mg by mouth daily.     Marland Kitchen. atenolol (TENORMIN) 25 MG tablet Take 37.5 mg by mouth daily. Take 1 & 1/2 Tablets (37.5 mg) By Mouth Daily.    . bimatoprost (LUMIGAN) 0.03 % ophthalmic solution Place 1 drop into the right eye at bedtime.     Marland Kitchen. LORazepam (ATIVAN) 0.5 MG tablet 1/2-1 tab po q8h prn agitation  30 tablet 2  . omeprazole (PRILOSEC) 40 MG capsule One tab PO QAM 30 capsule 3  . atenolol (TENORMIN) 25 MG tablet Take 1 tablet (25 mg total) by mouth daily. (Patient not taking: Reported on 04/18/2014) 90 tablet 3  . azithromycin (ZITHROMAX) 250 MG tablet Take 1 pill daily until gone 4 each 0  . furosemide (LASIX) 40 MG tablet 1/2 tab po qd 60 tablet 0  . losartan (COZAAR) 50 MG tablet Take 1 tablet (50 mg total) by mouth daily. (Patient not taking: Reported on 04/18/2014) 90 tablet 3  . simvastatin (ZOCOR) 20 MG tablet Take 1 tablet (20 mg total) by mouth daily. 90 tablet 3   No facility-administered medications prior to visit.    No Known Allergies  ROS As per HPI  PE: Blood pressure 112/70, pulse 91, SpO2 98 %. Gen: alert, sitting in wheelchair, lucid thought and speech. Oral mucosa moist/pink. CV: Irreg irreg, 3/6 syst murmur without rub --same as his usual exam, rate about 90. LUNGS: CTA bilat, nonlabored resps. ABd: soft, NT/ND  No pitting edema of abd wall. ARMS: no pitting edema. LEGS: 3+ bilat pitting edema from knees down.  LABS:  None today  IMPRESSION AND PLAN:  Failure to thrive, a-fib, suspected GERD/gastritis with  swallowing dysfunction. Pt has gone downhill to the point of needing 24/7 care and he'll be going to [facility] possibly as early as tomorrow. Extensive paperwork done and reviewed in prep for this. Meds reviewed, diet reviewed (soft, mechanical, pre-cut, + 1 can ensure qd).  An After Visit Summary was printed and given to the patient.  Spent 30 min with pt today, with >50% of this time spent in counseling and care coordination regarding the above problems.  FOLLOW UP: Return in about 3 months (around 08/11/2014) for routine chronic illness f/u.

## 2014-06-08 ENCOUNTER — Ambulatory Visit: Payer: Medicare Other | Admitting: Podiatry

## 2014-06-16 ENCOUNTER — Ambulatory Visit: Payer: Medicare Other

## 2014-06-30 ENCOUNTER — Ambulatory Visit: Payer: Medicare Other

## 2014-07-18 ENCOUNTER — Emergency Department (HOSPITAL_COMMUNITY)

## 2014-07-18 ENCOUNTER — Encounter (HOSPITAL_COMMUNITY): Payer: Self-pay

## 2014-07-18 ENCOUNTER — Emergency Department (HOSPITAL_COMMUNITY)
Admission: EM | Admit: 2014-07-18 | Discharge: 2014-07-18 | Disposition: A | Discharge status: Home / Self Care | Attending: Emergency Medicine | Admitting: Emergency Medicine

## 2014-07-18 DIAGNOSIS — Z79899 Other long term (current) drug therapy: Secondary | ICD-10-CM | POA: Diagnosis not present

## 2014-07-18 DIAGNOSIS — Z87891 Personal history of nicotine dependence: Secondary | ICD-10-CM | POA: Diagnosis not present

## 2014-07-18 DIAGNOSIS — N189 Chronic kidney disease, unspecified: Secondary | ICD-10-CM | POA: Diagnosis not present

## 2014-07-18 DIAGNOSIS — Z7409 Other reduced mobility: Secondary | ICD-10-CM | POA: Diagnosis not present

## 2014-07-18 DIAGNOSIS — J159 Unspecified bacterial pneumonia: Secondary | ICD-10-CM | POA: Insufficient documentation

## 2014-07-18 DIAGNOSIS — Y92128 Other place in nursing home as the place of occurrence of the external cause: Secondary | ICD-10-CM | POA: Insufficient documentation

## 2014-07-18 DIAGNOSIS — Y9389 Activity, other specified: Secondary | ICD-10-CM | POA: Diagnosis not present

## 2014-07-18 DIAGNOSIS — J449 Chronic obstructive pulmonary disease, unspecified: Secondary | ICD-10-CM | POA: Diagnosis not present

## 2014-07-18 DIAGNOSIS — N400 Enlarged prostate without lower urinary tract symptoms: Secondary | ICD-10-CM | POA: Insufficient documentation

## 2014-07-18 DIAGNOSIS — S0012XA Contusion of left eyelid and periocular area, initial encounter: Secondary | ICD-10-CM | POA: Insufficient documentation

## 2014-07-18 DIAGNOSIS — E785 Hyperlipidemia, unspecified: Secondary | ICD-10-CM | POA: Diagnosis not present

## 2014-07-18 DIAGNOSIS — H409 Unspecified glaucoma: Secondary | ICD-10-CM | POA: Diagnosis not present

## 2014-07-18 DIAGNOSIS — R4182 Altered mental status, unspecified: Secondary | ICD-10-CM | POA: Diagnosis present

## 2014-07-18 DIAGNOSIS — I129 Hypertensive chronic kidney disease with stage 1 through stage 4 chronic kidney disease, or unspecified chronic kidney disease: Secondary | ICD-10-CM | POA: Insufficient documentation

## 2014-07-18 DIAGNOSIS — W1839XA Other fall on same level, initial encounter: Secondary | ICD-10-CM | POA: Insufficient documentation

## 2014-07-18 DIAGNOSIS — Y998 Other external cause status: Secondary | ICD-10-CM | POA: Diagnosis not present

## 2014-07-18 DIAGNOSIS — I4891 Unspecified atrial fibrillation: Secondary | ICD-10-CM | POA: Diagnosis not present

## 2014-07-18 DIAGNOSIS — Z7982 Long term (current) use of aspirin: Secondary | ICD-10-CM | POA: Diagnosis not present

## 2014-07-18 DIAGNOSIS — J189 Pneumonia, unspecified organism: Secondary | ICD-10-CM

## 2014-07-18 LAB — TROPONIN I: Troponin I: 0.03 ng/mL (ref <0.031)

## 2014-07-18 LAB — COMPREHENSIVE METABOLIC PANEL
ALT: 24 U/L (ref 0–53)
AST: 35 U/L (ref 0–37)
Albumin: 3.5 g/dL (ref 3.5–5.2)
Alkaline Phosphatase: 100 U/L (ref 39–117)
Anion gap: 12 (ref 5–15)
BUN: 14 mg/dL (ref 6–23)
CO2: 25 mmol/L (ref 19–32)
CREATININE: 0.82 mg/dL (ref 0.50–1.35)
Calcium: 9 mg/dL (ref 8.4–10.5)
Chloride: 102 mmol/L (ref 96–112)
GFR, EST AFRICAN AMERICAN: 83 mL/min — AB (ref >90)
GFR, EST NON AFRICAN AMERICAN: 71 mL/min — AB (ref >90)
GLUCOSE: 106 mg/dL — AB (ref 70–99)
Potassium: 3.7 mmol/L (ref 3.5–5.1)
Sodium: 139 mmol/L (ref 135–145)
Total Bilirubin: 0.9 mg/dL (ref 0.3–1.2)
Total Protein: 6.1 g/dL (ref 6.0–8.3)

## 2014-07-18 LAB — CBC WITH DIFFERENTIAL/PLATELET
Basophils Absolute: 0 10*3/uL (ref 0.0–0.1)
Basophils Relative: 0 % (ref 0–1)
EOS PCT: 3 % (ref 0–5)
Eosinophils Absolute: 0.3 10*3/uL (ref 0.0–0.7)
HEMATOCRIT: 36.9 % — AB (ref 39.0–52.0)
Hemoglobin: 11.5 g/dL — ABNORMAL LOW (ref 13.0–17.0)
LYMPHS PCT: 12 % (ref 12–46)
Lymphs Abs: 1.1 10*3/uL (ref 0.7–4.0)
MCH: 24.7 pg — AB (ref 26.0–34.0)
MCHC: 31.2 g/dL (ref 30.0–36.0)
MCV: 79.4 fL (ref 78.0–100.0)
MONO ABS: 0.7 10*3/uL (ref 0.1–1.0)
Monocytes Relative: 8 % (ref 3–12)
Neutro Abs: 6.7 10*3/uL (ref 1.7–7.7)
Neutrophils Relative %: 77 % (ref 43–77)
Platelets: 216 10*3/uL (ref 150–400)
RBC: 4.65 MIL/uL (ref 4.22–5.81)
RDW: 18.5 % — AB (ref 11.5–15.5)
WBC: 8.8 10*3/uL (ref 4.0–10.5)

## 2014-07-18 LAB — URINALYSIS, ROUTINE W REFLEX MICROSCOPIC
BILIRUBIN URINE: NEGATIVE
Glucose, UA: NEGATIVE mg/dL
Hgb urine dipstick: NEGATIVE
KETONES UR: NEGATIVE mg/dL
NITRITE: NEGATIVE
Protein, ur: NEGATIVE mg/dL
Specific Gravity, Urine: 1.016 (ref 1.005–1.030)
Urobilinogen, UA: 0.2 mg/dL (ref 0.0–1.0)
pH: 6.5 (ref 5.0–8.0)

## 2014-07-18 LAB — URINE MICROSCOPIC-ADD ON

## 2014-07-18 MED ORDER — LEVOFLOXACIN 500 MG PO TABS
500.0000 mg | ORAL_TABLET | Freq: Once | ORAL | Status: AC
Start: 2014-07-18 — End: 2014-07-18
  Administered 2014-07-18: 500 mg via ORAL
  Filled 2014-07-18 (×2): qty 1

## 2014-07-18 MED ORDER — LEVOFLOXACIN 500 MG PO TABS: 500.0000 mg | ORAL_TABLET | Freq: Every day | ORAL | Status: AC

## 2014-07-18 NOTE — ED Notes (Signed)
[facility]  EMS reports that pt fell at facility on Thursday and refused treatment at that time.  Workers at facility reported that pt is usually A&O and pleasant but has become more confused and has been argumentative and combative at times.  Pt has obvious trauma to the left periorbital area and bridge of nose.

## 2014-07-18 NOTE — ED Notes (Signed)
[facility] to [facility]- secretary reports that nurse is unavailable to come to the phone right now but will call hospital back.

## 2014-07-18 NOTE — ED Notes (Signed)
Report provided to Barnes & NobleErinFirefighter- director, at [facility]

## 2014-07-18 NOTE — ED Notes (Signed)
MD Pfeiffer at bedside ° °

## 2014-07-18 NOTE — ED Notes (Signed)
Pt was attempting to crawl out of bed and stated "I have to get to my hotel because they have plans to cremate me today."  RN got pt repositioned in bed and tried to calm pt.  Lights were dimmed, warm blanket applied and TV turned on.  Pt is resting with eyes closed at this time.

## 2014-07-18 NOTE — ED Provider Notes (Signed)
CSN: [id number]     Arrival date & time 07/18/14  1127 History   First MD Initiated Contact with Patient 07/18/14 1132     Chief Complaint  Patient presents with  . Altered Mental Status   Level V caveat due to altered mental status  (Consider location/radiation/quality/duration/timing/severity/associated sxs/prior Treatment) Patient is a 81 y.o. male presenting with altered mental status. The history is provided by the patient.  Altered Mental Status  patient sent in for altered mental status. Reportedly had a fall on Thursday and refused transfer the ER. His hematoma to his left periorbital area and cheek. Now is been more combative and reportedly more confused. Patient states that he fell but states he fell yesterday morning and not 2 days ago. Denies dysuria. States he has had some chest pain and shortness of breath. No fevers. No abdominal pain. States he has had a good appetite. He sees mildly confused.  Past Medical History  Diagnosis Date  . HTN (hypertension)   . Hyperlipidemia   . BPH (benign prostatic hypertrophy)   . Glaucoma   . Diverticulosis of colon     w/ hx of "itis"  . Macular degeneration     L eye with minimal vision  . Impaired mobility   . Chronic venous insufficiency   . Impaired vision in both eyes     L>>R  . Atrial fibrillation 01/06/2014    Pt states he does not want ANY procedures or anticoagulant treatment (other than ASA  daily)  . COPD (chronic obstructive pulmonary disease)     radiographically   Past Surgical History  Procedure Laterality Date  . Inguinal hernia repair  2012    bilat  . Hemorrhoid surgery      remote past   History reviewed. No pertinent family history. History  Substance Use Topics  . Smoking status: Former Games developer  . Smokeless tobacco: Never Used  . Alcohol Use: Yes     Comment: 4-5 oz daily    Review of Systems  Unable to perform ROS     Allergies  Review of patient's allergies indicates no known  allergies.  Home Medications   Prior to Admission medications   Medication Sig Start Date End Date Taking? Authorizing Provider  aspirin EC 81 MG tablet Take 81 mg by mouth daily.   Yes Historical Provider, MD  bimatoprost (LUMIGAN) 0.01 % SOLN Place 1 drop into both eyes at bedtime.   Yes Historical Provider, MD  Cholecalciferol 2000 UNITS CAPS Take 2,000 Units by mouth daily.   Yes Historical Provider, MD  ENSURE PLUS (ENSURE PLUS) LIQD Take 1 Can by mouth every morning. Chocolate Flavor   Yes Historical Provider, MD  famotidine (PEPCID) 20 MG tablet Take 20 mg by mouth 2 (two) times daily.   Yes Historical Provider, MD  metoprolol succinate (TOPROL-XL) 25 MG 24 hr tablet Take 37.5 mg by mouth 2 (two) times daily.   Yes Historical Provider, MD  Psyllium (METAMUCIL SMOOTH TEXTURE PO) Take 3.5 g by mouth every morning.   Yes Historical Provider, MD  tamsulosin (FLOMAX) 0.4 MG CAPS capsule Take 0.8 mg by mouth at bedtime.   Yes Historical Provider, MD  levofloxacin (LEVAQUIN) 500 MG tablet Take 1 tablet (500 mg total) by mouth daily. 07/18/14   Arby Barrette, MD  LORazepam (ATIVAN) 0.5 MG tablet 1/2-1 tab po q8h prn agitation Patient taking differently: Take 0.5 mg by mouth every 8 (eight) hours as needed.  04/24/14   Jeoffrey Massed, MD  omeprazole (PRILOSEC) 40 MG capsule One tab PO QAM Patient not taking: Reported on 07/18/2014 04/16/14   Jeoffrey Massed, MD   BP 142/95 mmHg  Pulse 110  Temp(Src) 97.4 F (36.3 C) (Oral)  Resp 17  Ht  (1.575 m)  Wt 140 lb (63.504 kg)  BMI 25.60 kg/m2  SpO2 100% Physical Exam  Constitutional: He appears well-developed.  HENT:  Periorbital hematoma and ecchymosis on left. Some tenderness in the area. Also involves left zygomatic area.  Eyes: EOM are normal.  Left eye more dilated than right, chronic per patient.  Neck: Neck supple.  Cardiovascular: Normal rate and regular rhythm.   Pulmonary/Chest: Effort normal.  Abdominal: Soft.   Musculoskeletal: Normal range of motion.  Neurological: He is alert.  Some confusion. Moving all extremities  Skin: Skin is warm.    ED Course  Procedures (including critical care time) Labs Review Labs Reviewed  CBC WITH DIFFERENTIAL/PLATELET - Abnormal; Notable for the following:    Hemoglobin 11.5 (*)    HCT 36.9 (*)    MCH 24.7 (*)    RDW 18.5 (*)    All other components within normal limits  URINALYSIS, ROUTINE W REFLEX MICROSCOPIC - Abnormal; Notable for the following:    Color, Urine AMBER (*)    Leukocytes, UA SMALL (*)    All other components within normal limits  COMPREHENSIVE METABOLIC PANEL - Abnormal; Notable for the following:    Glucose, Bld 106 (*)    GFR calc non Af Amer 71 (*)    GFR calc Af Amer 83 (*)    All other components within normal limits  URINE MICROSCOPIC-ADD ON - Abnormal; Notable for the following:    Crystals CA OXALATE CRYSTALS (*)    All other components within normal limits  TROPONIN I    Imaging Review Dg Chest 2 View  07/18/2014   CLINICAL DATA:  Patient fell at assisted living facility on Thursday. Refused treatment at that time. Patient is usually alert and oriented but has become more confused an argument, combative. Trauma to the periorbital area and nose.  EXAM: CHEST  2 VIEW  COMPARISON:  04/18/2014  FINDINGS: Heart is enlarged. There are small bilateral pleural effusions. Right lower lobe infiltrate is present, consistent with infectious process. No pulmonary edema. Thoracic spondylosis. Chronic changes in both shoulders.  IMPRESSION: 1. Cardiomegaly without pulmonary edema. 2. Right lower lobe infiltrate.   Electronically Signed   By: Norva Pavlov M.D.   On: 07/18/2014 13:24   Ct Head Wo Contrast  07/18/2014   CLINICAL DATA:  The patient fell at assisted living facility on Thursday. Patient refused treatment at that time. Patient has become more confused, combative at times. Obvious trauma to the left periorbital region and the  bridge of the nose.  EXAM: CT HEAD WITHOUT CONTRAST  CT MAXILLOFACIAL WITHOUT CONTRAST  TECHNIQUE: Multidetector CT imaging of the head and maxillofacial structures were performed using the standard protocol without intravenous contrast. Multiplanar CT image reconstructions of the maxillofacial structures were also generated.  COMPARISON:  None.  FINDINGS: CT HEAD FINDINGS  There is significant central cortical atrophy. In periventricular white matter changes are consistent with small vessel disease. There is no intra or extra-axial fluid collection or mass lesion. The basilar cisterns and ventricles have a normal appearance. There is no CT evidence for acute infarction or hemorrhage. Bone windows show left frontal scalp edema. There is no underlying calvarial fracture. There are bilateral mastoid effusions. Left nasal bone fracture  is present. There is opacification of the paranasal sinuses.  CT MAXILLOFACIAL FINDINGS  There is a minimally displaced fracture of the left nasal bone. Bony nasal septum is intact. There is a laceration of the nose associated mild edema.  The orbits and globes are intact. The temporomandibular joints and mandible are intact. The zygomatic arches and the pterygoid plates are intact.  There is significant mucoperiosteal thickening of the left maxillary and scattered ethmoid air cells. No air-fluid levels to indicate is acute sinus wall fractures or acute sinusitis.  IMPRESSION: 1. Atrophy and small vessel disease. 2. Left frontal scalp edema not associated with underlying calvarial fracture. 3. Bilateral mastoid effusions. 4. Minimally displaced left nasal bone fracture. 5. Chronic sinusitis.   Electronically Signed   By: Norva PavlovElizabeth  Brown M.D.   On: 07/18/2014 14:03   Ct Maxillofacial Wo Cm  07/18/2014   CLINICAL DATA:  The patient fell at assisted living facility on Thursday. Patient refused treatment at that time. Patient has become more confused, combative at times. Obvious trauma  to the left periorbital region and the bridge of the nose.  EXAM: CT HEAD WITHOUT CONTRAST  CT MAXILLOFACIAL WITHOUT CONTRAST  TECHNIQUE: Multidetector CT imaging of the head and maxillofacial structures were performed using the standard protocol without intravenous contrast. Multiplanar CT image reconstructions of the maxillofacial structures were also generated.  COMPARISON:  None.  FINDINGS: CT HEAD FINDINGS  There is significant central cortical atrophy. In periventricular white matter changes are consistent with small vessel disease. There is no intra or extra-axial fluid collection or mass lesion. The basilar cisterns and ventricles have a normal appearance. There is no CT evidence for acute infarction or hemorrhage. Bone windows show left frontal scalp edema. There is no underlying calvarial fracture. There are bilateral mastoid effusions. Left nasal bone fracture is present. There is opacification of the paranasal sinuses.  CT MAXILLOFACIAL FINDINGS  There is a minimally displaced fracture of the left nasal bone. Bony nasal septum is intact. There is a laceration of the nose associated mild edema.  The orbits and globes are intact. The temporomandibular joints and mandible are intact. The zygomatic arches and the pterygoid plates are intact.  There is significant mucoperiosteal thickening of the left maxillary and scattered ethmoid air cells. No air-fluid levels to indicate is acute sinus wall fractures or acute sinusitis.  IMPRESSION: 1. Atrophy and small vessel disease. 2. Left frontal scalp edema not associated with underlying calvarial fracture. 3. Bilateral mastoid effusions. 4. Minimally displaced left nasal bone fracture. 5. Chronic sinusitis.   Electronically Signed   By: Norva PavlovElizabeth  Brown M.D.   On: 07/18/2014 14:03     EKG Interpretation   Date/Time:  Saturday July 18 2014 11:35:00 EDT Ventricular Rate:  111 PR Interval:    QRS Duration: 86 QT Interval:  351 QTC Calculation: 477 R Axis:    42 Text Interpretation:  Atrial fibrillation Ventricular premature complex  Anterior infarct, old Minimal ST depression, lateral leads Confirmed by  Rubin PayorPICKERING  MD, Tyliyah Mcmeekin [phone]([id number]) on 07/18/2014 11:59:33 AM      MDM   Final diagnoses:  HCAP (healthcare-associated pneumonia)    Patient from nursing home with some confusion. Pneumonia on xray. Labs otherwise reassuring. Recent fall with negative CT. D/c Home    Benjiman CoreNathan Makaveli Hoard, MD 07/19/14 [phone]

## 2014-07-18 NOTE — ED Notes (Signed)
PT placed in gown and in bed. Pt monitored by pulse ox, bp cuff, and 12-lead. 

## 2014-07-18 NOTE — ED Provider Notes (Signed)
Care was assumed for Dr. Rubin PayorPickering. At that time CT head was negative for any acute findings and plan was for follow-up on urinalysis for definitive management. I have reviewed the patient's prior history of falls and pre-existing medical conditions. I did have a conversation with the patient regarding frequent cough. He reports that this is more or less chronic for him and it clears up on its own at times. There is no specific report of fever and at this time no leukocytosis. However with the patient having cough that sounds moist and productive in chest x-ray indicating a localizing infiltrate as well as report from nursing home of him being possibly more combative, I felt that infectious etiology may be the cause of the symptoms and have opted to treat the patient with Levaquin for a suspected pneumonia. He is not showing any signs of respiratory distress and his cognitive function during my evaluation was high functioning. He did express his desire not to have advanced treatments for medical conditions. At this time he will be return to nursing home care with instructions for recheck with the primary treating physician for response to Levaquin for probable pneumonia.  Arby BarretteMarcy Lavonne Kinderman, MD 07/18/14 [phone]

## 2014-07-18 NOTE — Discharge Instructions (Signed)
Head Injury °You have received a head injury. It does not appear serious at this time. Headaches and vomiting are common following head injury. It should be easy to awaken from sleeping. Sometimes it is necessary for you to stay in the emergency department for a while for observation. Sometimes admission to the hospital may be needed. After injuries such as yours, most problems occur within the first 24 hours, but side effects may occur up to 7-10 days after the injury. It is important for you to carefully monitor your condition and contact your health care provider or seek immediate medical care if there is a change in your condition. °WHAT ARE THE TYPES OF HEAD INJURIES? °Head injuries can be as minor as a bump. Some head injuries can be more severe. More severe head injuries include: °· A jarring injury to the brain (concussion). °· A bruise of the brain (contusion). This mean there is bleeding in the brain that can cause swelling. °· A cracked skull (skull fracture). °· Bleeding in the brain that collects, clots, and forms a bump (hematoma). °WHAT CAUSES A HEAD INJURY? °A serious head injury is most likely to happen to someone who is in a car wreck and is not wearing a seat belt. Other causes of major head injuries include bicycle or motorcycle accidents, sports injuries, and falls. °HOW ARE HEAD INJURIES DIAGNOSED? °A complete history of the event leading to the injury and your current symptoms will be helpful in diagnosing head injuries. Many times, pictures of the brain, such as CT or MRI are needed to see the extent of the injury. Often, an overnight hospital stay is necessary for observation.  °WHEN SHOULD I SEEK IMMEDIATE MEDICAL CARE?  °You should get help right away if: °· You have confusion or drowsiness. °· You feel sick to your stomach (nauseous) or have continued, forceful vomiting. °· You have dizziness or unsteadiness that is getting worse. °· You have severe, continued headaches not relieved by  medicine. Only take over-the-counter or prescription medicines for pain, fever, or discomfort as directed by your health care provider. °· You do not have normal function of the arms or legs or are unable to walk. °· You notice changes in the black spots in the center of the colored part of your eye (pupil). °· You have a clear or bloody fluid coming from your nose or ears. °· You have a loss of vision. °During the next 24 hours after the injury, you must stay with someone who can watch you for the warning signs. This person should contact local emergency services (911 in the [place] if you have seizures, you become unconscious, or you are unable to wake up. °HOW CAN I PREVENT A HEAD INJURY IN THE FUTURE? °The most important factor for preventing major head injuries is avoiding motor vehicle accidents.  To minimize the potential for damage to your head, it is crucial to wear seat belts while riding in motor vehicles. Wearing helmets while bike riding and playing collision sports (like football) is also helpful. Also, avoiding dangerous activities around the house will further help reduce your risk of head injury.  °WHEN CAN I RETURN TO NORMAL ACTIVITIES AND ATHLETICS? °You should be reevaluated by your health care provider before returning to these activities. If you have any of the following symptoms, you should not return to activities or contact sports until 1 week after the symptoms have stopped: °· Persistent headache. °· Dizziness or vertigo. °· Poor attention and concentration. °· Confusion. °·   Memory problems.  Nausea or vomiting.  Fatigue or tire easily.  Irritability.  Intolerant of bright lights or loud noises.  Anxiety or depression.  Disturbed sleep. MAKE SURE YOU:   Understand these instructions.  Will watch your condition.  Will get help right away if you are not doing well or get worse. Document Released: 03/06/2005 Document Revised: 03/11/2013 Document Reviewed:  11/11/2012 [facility] Patient Information 2015 [place], [place]. This information is not intended to replace advice given to you by your health care provider. Make sure you discuss any questions you have with your health care provider. Pneumonia Pneumonia is an infection of the lungs.  CAUSES Pneumonia may be caused by bacteria or a virus. Usually, these infections are caused by breathing infectious particles into the lungs (respiratory tract). SIGNS AND SYMPTOMS   Cough.  Fever.  Chest pain.  Increased rate of breathing.  Wheezing.  Mucus production. DIAGNOSIS  If you have the common symptoms of pneumonia, your health care provider will typically confirm the diagnosis with a chest X-ray. The X-ray will show an abnormality in the lung (pulmonary infiltrate) if you have pneumonia. Other tests of your blood, urine, or sputum may be done to find the specific cause of your pneumonia. Your health care provider may also do tests (blood gases or pulse oximetry) to see how well your lungs are working. TREATMENT  Some forms of pneumonia may be spread to other people when you cough or sneeze. You may be asked to wear a mask before and during your exam. Pneumonia that is caused by bacteria is treated with antibiotic medicine. Pneumonia that is caused by the influenza virus may be treated with an antiviral medicine. Most other viral infections must run their course. These infections will not respond to antibiotics.  HOME CARE INSTRUCTIONS   Cough suppressants may be used if you are losing too much rest. However, coughing protects you by clearing your lungs. You should avoid using cough suppressants if you can.  Your health care provider may have prescribed medicine if he or she thinks your pneumonia is caused by bacteria or influenza. Finish your medicine even if you start to feel better.  Your health care provider may also prescribe an expectorant. This loosens the mucus to be coughed up.  Take  medicines only as directed by your health care provider.  Do not smoke. Smoking is a common cause of bronchitis and can contribute to pneumonia. If you are a smoker and continue to smoke, your cough may last several weeks after your pneumonia has cleared.  A cold steam vaporizer or humidifier in your room or home may help loosen mucus.  Coughing is often worse at night. Sleeping in a semi-upright position in a recliner or using a couple pillows under your head will help with this.  Get rest as you feel it is needed. Your body will usually let you know when you need to rest. PREVENTION A pneumococcal shot (vaccine) is available to prevent a common bacterial cause of pneumonia. This is usually suggested for:  People over 89 years old.  Patients on chemotherapy.  People with chronic lung problems, such as bronchitis or emphysema.  People with immune system problems. If you are over 65 or have a high risk condition, you may receive the pneumococcal vaccine if you have not received it before. In some countries, a routine influenza vaccine is also recommended. This vaccine can help prevent some cases of pneumonia.You may be offered the influenza vaccine as part of  your care. If you smoke, it is time to quit. You may receive instructions on how to stop smoking. Your health care provider can provide medicines and counseling to help you quit. SEEK MEDICAL CARE IF: You have a fever. SEEK IMMEDIATE MEDICAL CARE IF:   Your illness becomes worse. This is especially true if you are elderly or weakened from any other disease.  You cannot control your cough with suppressants and are losing sleep.  You begin coughing up blood.  You develop pain which is getting worse or is uncontrolled with medicines.  Any of the symptoms which initially brought you in for treatment are getting worse rather than better.  You develop shortness of breath or chest pain. MAKE SURE YOU:   Understand these  instructions.  Will watch your condition.  Will get help right away if you are not doing well or get worse. Document Released: 03/06/2005 Document Revised: 07/21/2013 Document Reviewed: 05/26/2010 [facility] Patient Information 2015 [place]. This information is not intended to replace advice given to you by your health care provider. Make sure you discuss any questions you have with your health care provider.

## 2014-07-18 NOTE — ED Notes (Signed)
[facility] contacted and will come transport pt

## 2014-08-19 DEATH — deceased

## 2016-11-13 IMAGING — CR DG CHEST 2V
2 series · 2 of 2 positions shown · non-contrast
Comparison: 04/18/2014

CLINICAL DATA: Patient fell at [REDACTED] on
[REDACTED]. Refused treatment at that time. Patient is usually alert
and oriented but has become more confused an argument, combative.
Trauma to the periorbital area and nose.

EXAM:
CHEST  2 VIEW

[x chest ap]
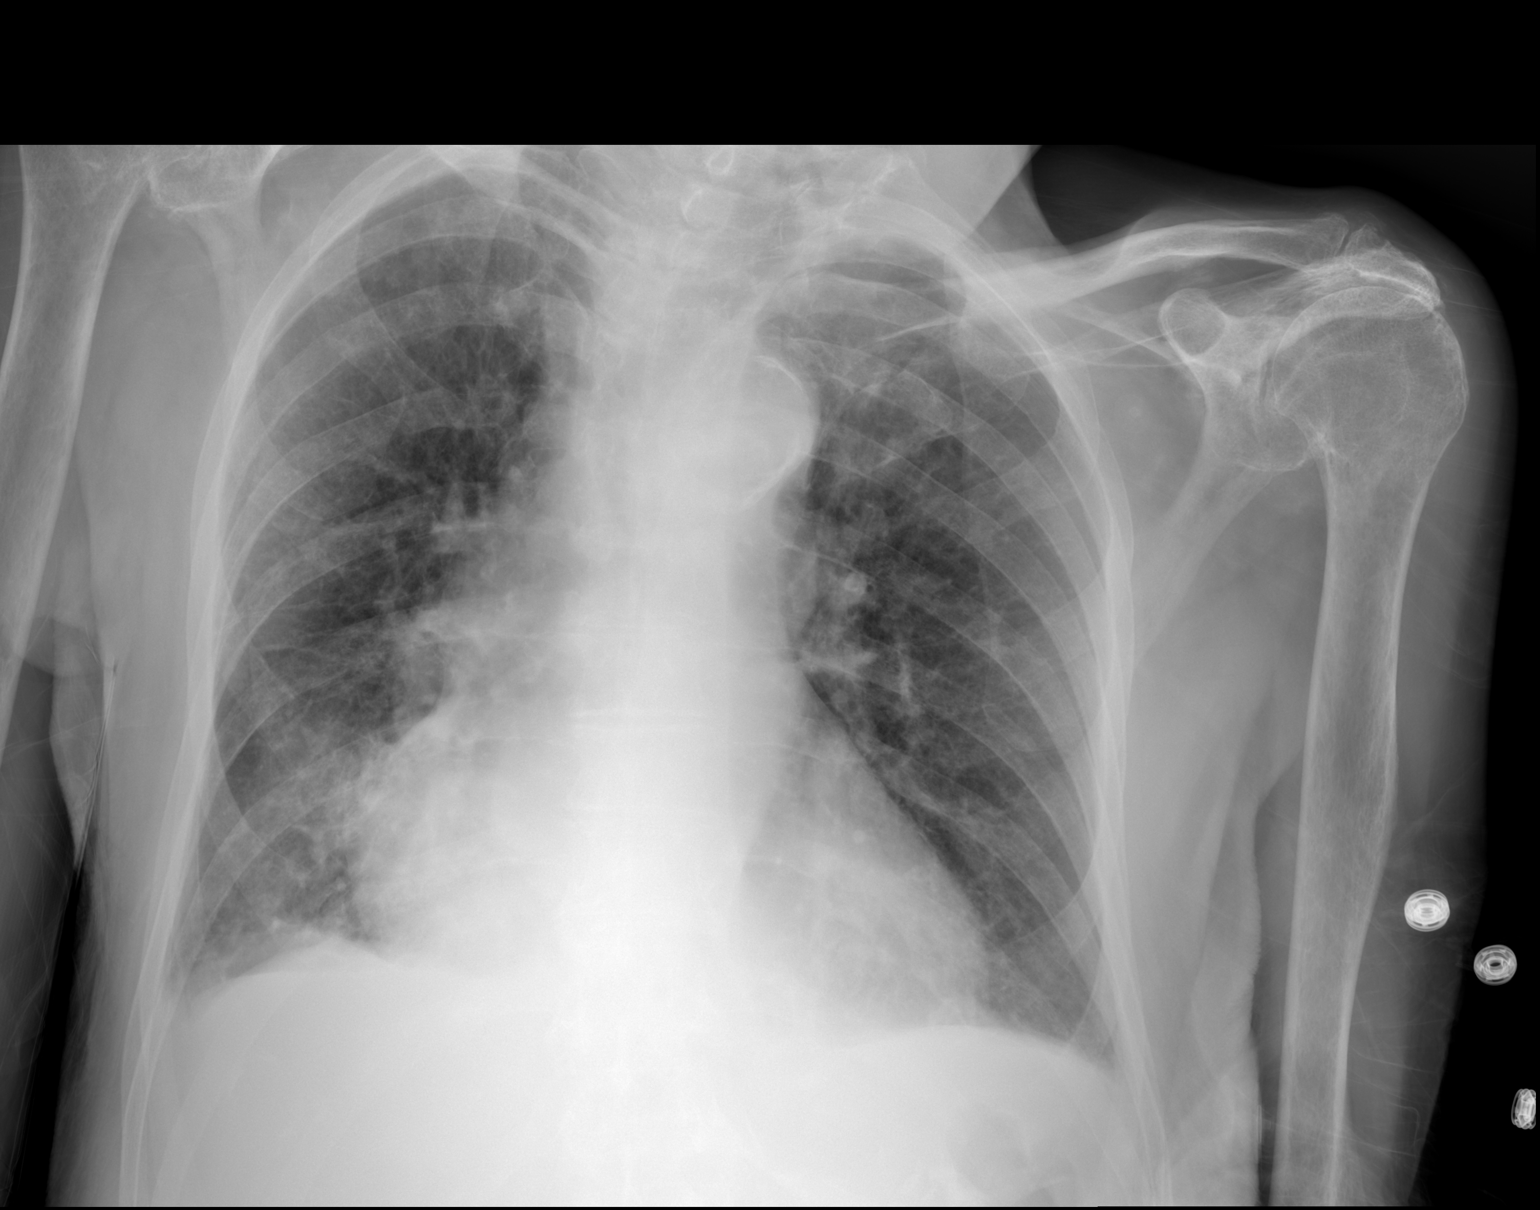

[w chest lat]
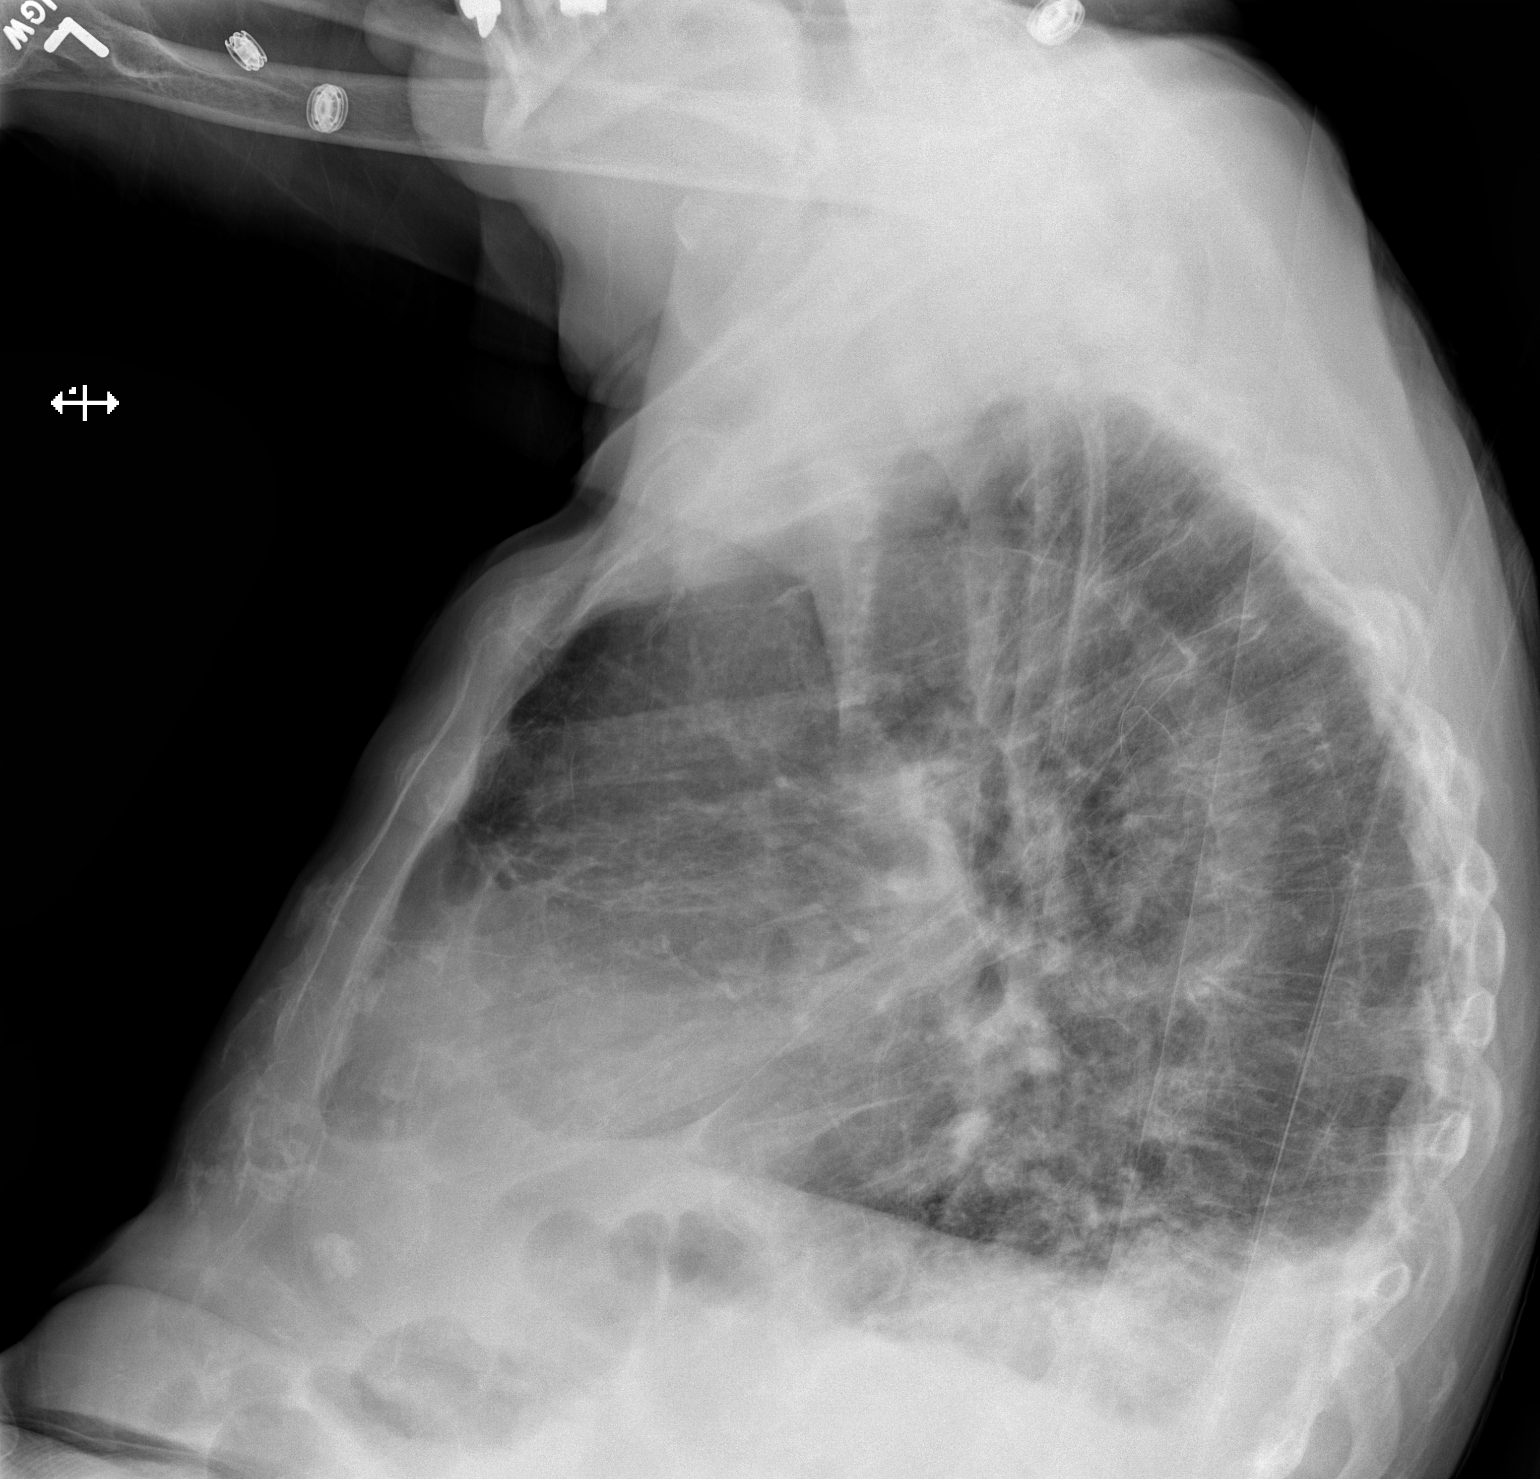

[2 of 2 positions shown; findings below may reference images not displayed]

FINDINGS: Heart is enlarged. There are small bilateral pleural effusions.
Right lower lobe infiltrate is present, consistent with infectious
process. No pulmonary edema. Thoracic spondylosis. Chronic changes
in both shoulders.
IMPRESSION: 1. Cardiomegaly without pulmonary edema.
2. Right lower lobe infiltrate.

## 2022-03-08 NOTE — Progress Notes (Signed)
This encounter was created in error - please disregard.
